# Patient Record
Sex: Male | Born: 1975
Health system: Southern US, Community
[De-identification: ages and names within clinical notes are randomized; demographics above are authoritative.]

## PROBLEM LIST (undated history)

## (undated) DIAGNOSIS — G8929 Other chronic pain: Secondary | ICD-10-CM

## (undated) DIAGNOSIS — T4145XA Adverse effect of unspecified anesthetic, initial encounter: Secondary | ICD-10-CM

## (undated) DIAGNOSIS — R52 Pain, unspecified: Secondary | ICD-10-CM

## (undated) DIAGNOSIS — M549 Dorsalgia, unspecified: Secondary | ICD-10-CM

## (undated) DIAGNOSIS — T8859XA Other complications of anesthesia, initial encounter: Secondary | ICD-10-CM

## (undated) HISTORY — DX: Other chronic pain: G89.29

## (undated) HISTORY — PX: SHOULDER SURGERY: SHX246

## (undated) HISTORY — PX: VASECTOMY: SHX75

## (undated) HISTORY — PX: OTHER SURGICAL HISTORY: SHX169

## (undated) HISTORY — PX: LEG SURGERY: SHX1003

## (undated) HISTORY — DX: Dorsalgia, unspecified: M54.9

---

## 2008-07-02 ENCOUNTER — Ambulatory Visit: Payer: Self-pay | Admitting: Orthopedic Surgery

## 2009-10-20 ENCOUNTER — Ambulatory Visit: Payer: Self-pay | Admitting: Internal Medicine

## 2013-01-09 ENCOUNTER — Emergency Department: Payer: Self-pay | Admitting: Emergency Medicine

## 2014-08-13 ENCOUNTER — Encounter: Payer: Self-pay | Admitting: Family Medicine

## 2014-08-13 DIAGNOSIS — Z8781 Personal history of (healed) traumatic fracture: Secondary | ICD-10-CM

## 2014-08-13 DIAGNOSIS — S39012A Strain of muscle, fascia and tendon of lower back, initial encounter: Secondary | ICD-10-CM | POA: Insufficient documentation

## 2014-10-01 DIAGNOSIS — Z0000 Encounter for general adult medical examination without abnormal findings: Secondary | ICD-10-CM | POA: Insufficient documentation

## 2014-10-04 ENCOUNTER — Ambulatory Visit (INDEPENDENT_AMBULATORY_CARE_PROVIDER_SITE_OTHER): Payer: Medicaid Other | Admitting: Family Medicine

## 2014-10-04 ENCOUNTER — Encounter: Payer: Self-pay | Admitting: Family Medicine

## 2014-10-04 VITALS — BP 136/78 | HR 78 | Ht 68.0 in | Wt 184.0 lb

## 2014-10-04 DIAGNOSIS — M5442 Lumbago with sciatica, left side: Secondary | ICD-10-CM

## 2014-10-04 DIAGNOSIS — M5441 Lumbago with sciatica, right side: Secondary | ICD-10-CM

## 2014-10-04 DIAGNOSIS — Z3009 Encounter for other general counseling and advice on contraception: Secondary | ICD-10-CM | POA: Diagnosis not present

## 2014-10-04 LAB — HEMOCCULT GUIAC POC 1CARD (OFFICE): FECAL OCCULT BLD: NEGATIVE

## 2014-10-04 MED ORDER — CYCLOBENZAPRINE HCL 10 MG PO TABS: 10.0000 mg | ORAL_TABLET | Freq: Three times a day (TID) | ORAL | Status: DC | PRN

## 2014-10-04 NOTE — Progress Notes (Signed)
Name: Cole Austin   MRN: [id number]    DOB: 03-13-1976   Date:10/04/2014       Progress Note  Subjective  Chief Complaint  Chief Complaint  Patient presents with  . Sterilization    consultation for vasectomy  . Back Pain    needs refill on Flexeril    Back Pain This is a recurrent problem. The current episode started more than 1 year ago. The problem occurs daily. The problem is unchanged. The pain is present in the lumbar spine and sacro-iliac. The quality of the pain is described as aching. The pain is mild. The pain is worse during the day. The symptoms are aggravated by bending and twisting. Pertinent negatives include no abdominal pain, bladder incontinence, bowel incontinence, headaches, leg pain, numbness, paresis, paresthesias, perianal numbness, tingling, weakness or weight loss. He has tried analgesics for the symptoms. The treatment provided mild relief.    No problem-specific assessment & plan notes found for this encounter.   Past Medical History  Diagnosis Date  . Chronic back pain     Past Surgical History  Procedure Laterality Date  . Leg surgery      relieve pressure in leg  . Shoulder surgery Right     torn ligament/ exploratory  . Mouth wired      Family History  Problem Relation Age of Onset  . Cancer Maternal Uncle   . Heart disease Maternal Grandfather     History   Social History  . Marital Status: Married    Spouse Name: N/A  . Number of Children: N/A  . Years of Education: N/A   Occupational History  . Not on file.   Social History Main Topics  . Smoking status: Current Every Day Smoker  . Smokeless tobacco: Not on file  . Alcohol Use: 0.0 oz/week    0 Standard drinks or equivalent per week  . Drug Use: No  . Sexual Activity: Yes   Other Topics Concern  . Not on file   Social History Narrative  . No narrative on file    No Known Allergies   Review of Systems  Constitutional: Negative.  Negative for weight loss.   HENT: Negative.   Eyes: Negative.   Respiratory: Negative.   Cardiovascular: Negative.   Gastrointestinal: Negative.  Negative for abdominal pain, constipation, blood in stool and bowel incontinence.  Genitourinary: Negative.  Negative for bladder incontinence.  Musculoskeletal: Positive for myalgias and back pain. Negative for joint pain and falls.       Recurrent spasm  Skin: Negative.  Negative for rash.  Neurological: Negative for tingling, sensory change, focal weakness, weakness, numbness, headaches and paresthesias.  Endo/Heme/Allergies: Negative.   Psychiatric/Behavioral: Negative for depression.       No anhedonism     Objective  Filed Vitals:   10/04/14 0811  BP: 136/78  Pulse: 78  Height: 5\' 8"  (1.727 m)  Weight: 184 lb (83.462 kg)    Physical Exam  Constitutional: He is oriented to person, place, and time and well-developed, well-nourished, and in no distress.  HENT:  Head: Normocephalic and atraumatic.  Right Ear: External ear normal.  Left Ear: External ear normal.  Nose: Nose normal.  Mouth/Throat: Oropharynx is clear and moist.  Eyes: Conjunctivae and EOM are normal. Pupils are equal, round, and reactive to light.  Neck: Normal range of motion. Neck supple. No JVD present. No thyromegaly present.  Cardiovascular: Normal rate, regular rhythm, normal heart sounds and intact distal pulses.  No murmur heard. Pulmonary/Chest: Effort normal and breath sounds normal.  Abdominal: Soft. Bowel sounds are normal. He exhibits no distension. There is no tenderness. There is no guarding.  Genitourinary: Rectum normal, prostate normal and penis normal. Guaiac negative stool.  Musculoskeletal: Normal range of motion. He exhibits no edema or tenderness.  Neurological: He is alert and oriented to person, place, and time. He has normal reflexes. He displays normal reflexes. No cranial nerve deficit. He exhibits normal muscle tone. Gait normal.  Skin: Skin is warm and dry.  No erythema.  Psychiatric: Mood and affect normal.      No results found for this or any previous visit (from the past 2160 hour(s)).   Assessment & Plan  Problem List Items Addressed This Visit    None    Visit Diagnoses    Bilateral low back pain with sciatica, sciatica laterality unspecified    -  Primary    Relevant Medications    cyclobenzaprine (FLEXERIL) 10 MG tablet    Sterilization consult        Relevant Orders    POCT Occult Blood Stool         Dr. Deanna Jones [facility]  10/04/2014

## 2014-10-19 ENCOUNTER — Ambulatory Visit: Payer: Medicaid Other | Admitting: Urology

## 2014-10-20 ENCOUNTER — Encounter: Payer: Self-pay | Admitting: Urology

## 2014-10-20 ENCOUNTER — Ambulatory Visit (INDEPENDENT_AMBULATORY_CARE_PROVIDER_SITE_OTHER): Payer: Medicaid Other | Admitting: Urology

## 2014-10-20 VITALS — BP 124/74 | HR 90 | Resp 18 | Ht 68.0 in | Wt 186.7 lb

## 2014-10-20 DIAGNOSIS — Z309 Encounter for contraceptive management, unspecified: Secondary | ICD-10-CM | POA: Diagnosis not present

## 2014-10-20 DIAGNOSIS — Z3009 Encounter for other general counseling and advice on contraception: Secondary | ICD-10-CM | POA: Insufficient documentation

## 2014-10-20 MED ORDER — DIAZEPAM 10 MG PO TABS: 10.0000 mg | ORAL_TABLET | Freq: Four times a day (QID) | ORAL | Status: DC | PRN

## 2014-10-20 NOTE — Progress Notes (Signed)
10/20/2014 2:50 PM   Cole Austin 25-Oct-1975 [id number]  Referring provider: Juline Patch, MD [address] [place], [place] [address]  Chief Complaint  Patient presents with  . VAS Consult    HPI: Vasectomy Consult Note Number of children: 4 Patient has no history of chronic prostatitis, epididymitis, orchitis, or other genital pain:  Today, we discussed what the vas deferens is, where it is located, and its function. We reviewed the procedure for vasectomy, it's risks, benefits, alternatives, and likelihood of achieving his goals. We discussed in detail the procedure, complications, and recovery as well as the need for clearance prior to unprotected intercourse. We discussed that vasectomy does not protect against sexually transmitted diseases. We discussed that this procedure does not result in immediate sterility and that they would need to use other forms of birth control until he has been cleared with a three month negative postvasectomy semen analyses. I explained that the procedure is considered to be permanent and that attempts at reversal have varying degrees of success. These options include vasectomy reversal, sperm retrieval, and in vitro fertilization; these can be very expensive. We discussed the chance of postvasectomy pain syndrome which occurs in less than 5% of patients. I explained to the patient that there is no treatment to resolve this chronic pain, and that if it developed I would not be able to help resolve the issue, but that surgery is generally not needed for correction. I explained there have even been reports of systemic like illness associated with this chronic pain, and that there was no good cure. I explained that vasectomy it is not a 100% reliable form of birth control, and the risk of pregnancy after vasectomy is approximately 1 in 2000 men who had a negative postvasectomy semen analysis or rare non-motile sperm. I explained that repeat  vasectomy was necessary in less than 1% of vasectomy procedures when employing the type of technique that is performed in the office. I explained that he should refrain from ejaculation for approximately one week following vasectomy. I explained that there are other options for birth control which are permanent and non-permanent; we discussed these. I explained the rates of surgical complications, such as symptomatic hematoma or infection, are low (1-2%) and vary with the surgeon's experience and criteria used to diagnose the complication.  The patient had the opportunity to ask questions to his stated satisfaction. He voiced understanding of the above factors and stated that he has read all the information provided to him and the packets and informed consent.   PMH: Past Medical History  Diagnosis Date  . Chronic back pain     Surgical History: Past Surgical History  Procedure Laterality Date  . Leg surgery      relieve pressure in leg  . Shoulder surgery Right     torn ligament/ exploratory  . Mouth wired      Home Medications:    Medication List       This list is accurate as of: 10/20/14  2:50 PM.  Always use your most recent med list.               cyclobenzaprine 10 MG tablet  Commonly known as:  FLEXERIL  Take 1 tablet (10 mg total) by mouth every 8 (eight) hours as needed.        Allergies: No Known Allergies  Family History: Family History  Problem Relation Age of Onset  . Cancer Maternal Uncle   . Heart  disease Maternal Grandfather     Social History:  reports that he has been smoking.  He does not have any smokeless tobacco history on file. He reports that he drinks alcohol. He reports that he does not use illicit drugs.  ROS: Urological Symptom Review  Patient is experiencing the following symptoms: Vas consult   Review of Systems  Gastrointestinal (upper)  : Negative for upper GI symptoms  Gastrointestinal (lower) : Negative for lower GI  symptoms  Constitutional : Negative for symptoms  Skin: Negative for skin symptoms  Eyes: Negative for eye symptoms  Ear/Nose/Throat : Negative for Ear/Nose/Throat symptoms  Hematologic/Lymphatic: Negative for Hematologic/Lymphatic symptoms  Cardiovascular : Negative for cardiovascular symptoms  Respiratory : Cough  Endocrine: Negative for endocrine symptoms  Musculoskeletal: Negative for musculoskeletal symptoms  Neurological: Negative for neurological symptoms  Psychologic: Negative for psychiatric symptoms   Physical Exam: BP 124/74 mmHg  Pulse 90  Resp 18  Ht 5\' 8"  (1.727 m)  Wt 186 lb 11.2 oz (84.687 kg)  BMI 28.39 kg/m2  GU: Patient with circumscied phallus.  Urethral meatus is patent.  No penile discharge. No penile lesions or rashes. Scrotum without lesions, cysts, rashes and/or edema.  Testicles are located scrotally bilaterally. No masses are appreciated in the testicles. Left and right epididymis are normal.   Laboratory Data: No results found for: WBC, HGB, HCT, MCV, PLT  No results found for: CREATININE  No results found for: PSA  No results found for: TESTOSTERONE  No results found for: HGBA1C  Urinalysis No results found for: COLORURINE, APPEARANCEUR, LABSPEC, PHURINE, GLUCOSEU, HGBUR, BILIRUBINUR, KETONESUR, PROTEINUR, UROBILINOGEN, NITRITE, LEUKOCYTESUR  Pertinent Imaging:   Assessment & Plan:       1. Vasectomy consult:   Patient has read and signed the consent.  He is given the pre-op vasectomy instruction sheet.  He is prescribed Valium 10 mg and instructed to take it 30 minutes prior to his vasectomy appointment.  He is to have a driver.  I reemphasized to the patient that this is to be considered a permanent form of birth control, that he is to use an alternative form of birth control until we receive the 3 months specimen and it is cleared of sperm and that this will not prevent STI's.  His questions are answered to his  satisfaction and he understands the risks and is willing to proceed with the vasectomy.  He will schedule his vasectomy.      There are no diagnoses linked to this encounter.  No Follow-up on file.  Zara Council, [facility] [address] [place], [place] [address] [phone]

## 2014-11-08 ENCOUNTER — Other Ambulatory Visit: Payer: Self-pay

## 2014-11-08 ENCOUNTER — Telehealth: Payer: Self-pay | Admitting: Urology

## 2014-11-08 DIAGNOSIS — Z3009 Encounter for other general counseling and advice on contraception: Secondary | ICD-10-CM

## 2014-11-08 MED ORDER — DIAZEPAM 10 MG PO TABS: 10.0000 mg | ORAL_TABLET | Freq: Four times a day (QID) | ORAL | Status: DC | PRN

## 2014-11-08 NOTE — Telephone Encounter (Signed)
Patient called this morning about his prescription for valium.  He went to the [facility] in [place], [place] and they did not have the prescription.  Will you please call it in for him?

## 2014-11-08 NOTE — Progress Notes (Signed)
Valium faxed to pharmacy.

## 2014-11-11 ENCOUNTER — Ambulatory Visit (INDEPENDENT_AMBULATORY_CARE_PROVIDER_SITE_OTHER): Payer: Medicaid Other | Admitting: Urology

## 2014-11-11 VITALS — BP 107/73 | HR 81 | Ht 68.0 in | Wt 182.0 lb

## 2014-11-11 DIAGNOSIS — Z302 Encounter for sterilization: Secondary | ICD-10-CM | POA: Diagnosis not present

## 2014-11-11 NOTE — Progress Notes (Signed)
Bilateral Vasectomy Procedure  Pre-Procedure: - Patient's scrotum was prepped and draped for vasectomy. - The vas was palpated through the scrotal skin on the left. - 1% Xylocaine was injected into the midline skin and surrounding tissue for placement  - In a similar manner, the vas on the right was identified, anesthetized, and stabilized.  Procedure: - A bladeless technique was used to open the overlying skin (sharp dissection) - The left vas was isolated and brought up through the incision exposing that structure. - Bleeding points were cauterized as they occurred. - The vas was free from the surrounding structures and brought to the view. - A segment was positioned for placement with a hemostat. - A second hemostat was placed and a small segment between the two hemostats and was removed for inspection. - Each end of the transected vas lumen was fulgurated/ obliterated using needlepoint electrocautery -A fascial interposition was performed on testicular end of the vas using #3-0 chromic suture -The same procedure was performed on the right. - A single suture of #3-0 chromic catgut was used to close each lateral scrotal skin incision - A dressing was applied.  Post-Procedure: - Patient was instructed in care of the operative area - A specimen is to be delivered in 12 weeks   -Another form of contraception is to be used until

## 2015-07-05 ENCOUNTER — Other Ambulatory Visit: Payer: Self-pay

## 2015-07-05 DIAGNOSIS — Z9852 Vasectomy status: Secondary | ICD-10-CM

## 2015-07-06 ENCOUNTER — Other Ambulatory Visit: Payer: Medicaid Other

## 2015-07-06 DIAGNOSIS — Z9852 Vasectomy status: Secondary | ICD-10-CM

## 2015-07-07 LAB — POST-VAS SPERM EVALUATION,QUAL: Volume: 1.5 mL

## 2015-07-11 ENCOUNTER — Telehealth: Payer: Self-pay

## 2015-07-11 NOTE — Telephone Encounter (Signed)
Patient notified and was transferred to the front to be scheduled/SW

## 2015-07-11 NOTE — Telephone Encounter (Signed)
-----   Message from Hollice Espy, MD sent at 07/08/2015 10:51 AM EDT ----- Please let him know there ARE sperm and unfortunately I think his vasectomy did not work.  Please have him come back to see [place] in the clinic.    Hollice Espy, MD

## 2015-07-29 ENCOUNTER — Encounter: Payer: Self-pay | Admitting: Urology

## 2015-07-29 ENCOUNTER — Ambulatory Visit (INDEPENDENT_AMBULATORY_CARE_PROVIDER_SITE_OTHER): Payer: Medicaid Other | Admitting: Urology

## 2015-07-29 VITALS — BP 115/73 | HR 80 | Temp 98.3°F | Resp 14 | Ht 68.0 in | Wt 197.0 lb

## 2015-07-29 DIAGNOSIS — Z309 Encounter for contraceptive management, unspecified: Secondary | ICD-10-CM

## 2015-07-29 DIAGNOSIS — Z3009 Encounter for other general counseling and advice on contraception: Secondary | ICD-10-CM

## 2015-07-29 NOTE — Progress Notes (Signed)
07/29/2015 5:57 PM   Cole Austin [birth year] [id number]  Referring provider: Juline Patch, MD [address] [place], [place] [address]  Chief Complaint  Patient presents with  . Advice Only    Patient is here for consultation after having vasectomy done. Procedure did not work.    HPI: 40 year old male who underwent vasectomy on 11/11/2014 by Dr. Elnoria Howard. He returned for follow-up semen analysis on 07/06/2015 which showed persistent sperm concerning for failure of the vasectomy. He was asked to return today to discuss the possibility of repeat vasectomy.  He had no issues following his initial procedure.  He does have 4 children and desires no further biological children.   PMH: Past Medical History  Diagnosis Date  . Chronic back pain     Surgical History: Past Surgical History  Procedure Laterality Date  . Leg surgery      relieve pressure in leg  . Shoulder surgery Right     torn ligament/ exploratory  . Mouth wired      Home Medications:    Medication List       This list is accurate as of: 07/29/15  5:57 PM.  Always use your most recent med list.               cyclobenzaprine 10 MG tablet  Commonly known as:  FLEXERIL  Take 1 tablet (10 mg total) by mouth every 8 (eight) hours as needed.        Allergies: No Known Allergies  Family History: Family History  Problem Relation Age of Onset  . Cancer Maternal Uncle   . Heart disease Maternal Grandfather   . Kidney cancer Neg Hx   . Prostate cancer Neg Hx     Social History:  reports that he has been smoking.  His smokeless tobacco use includes Chew. He reports that he drinks alcohol. He reports that he does not use illicit drugs.  ROS: UROLOGY Frequent Urination?: No Hard to postpone urination?: No Burning/pain with urination?: No Get up at night to urinate?: No Leakage of urine?: No Urine stream starts and stops?: No Trouble starting stream?: No Do you have to strain to  urinate?: No Blood in urine?: No Urinary tract infection?: No Sexually transmitted disease?: No Injury to kidneys or bladder?: No Painful intercourse?: No Weak stream?: No Erection problems?: No Penile pain?: No  Gastrointestinal Nausea?: No Vomiting?: No Indigestion/heartburn?: No Diarrhea?: No Constipation?: No  Constitutional Fever: No Night sweats?: No Weight loss?: No Fatigue?: No  Skin Skin rash/lesions?: No Itching?: No  Eyes Blurred vision?: No Double vision?: No  Ears/Nose/Throat Sore throat?: No Sinus problems?: No  Hematologic/Lymphatic Swollen glands?: No Easy bruising?: No  Cardiovascular Leg swelling?: No Chest pain?: No  Respiratory Cough?: No Shortness of breath?: No  Endocrine Excessive thirst?: No  Musculoskeletal Back pain?: No Joint pain?: No  Neurological Headaches?: No Dizziness?: No  Psychologic Depression?: No Anxiety?: No  Physical Exam: BP 115/73 mmHg  Pulse 80  Temp(Src) 98.3 F (36.8 C)  Resp 14  Ht 5\' 8"  (1.727 m)  Wt 197 lb (89.359 kg)  BMI 29.96 kg/m2  Constitutional:  Alert and oriented, No acute distress. HEENT: [place] AT, moist mucus membranes.  Trachea midline, no masses. Cardiovascular: No clubbing, cyanosis, or edema. Respiratory: Normal respiratory effort, no increased work of breathing. GI: Abdomen is soft, nontender, nondistended, no abdominal masses GU: No CVA tenderness. Bilateral descended testicles, normal scrotum. Small midline vasectomy incision noted. Left vas palpable but  somewhat posterior. Vas defect is appreciated. Right vas is difficult to palpate due to possible lipoma of the cord.  Circumcised phallus. Skin: No rashes, bruises or suspicious lesions. Neurologic: Grossly intact, no focal deficits, moving all 4 extremities. Psychiatric: Normal mood and affect.   Assessment & Plan:   1. Vasectomy evaluation S/p vasectomy failure with persistent sperm on SA We discussed that there is a  very low possibility of this and unfortunately his vasectomy seems to have failed. He was offered repeat vasectomy. Given difficulty palpating his right cord, I have advised him to undergo vasectomy redo in the operating room Risk and benefits of procedure were discussed at length today including risk of bleeding, infection, hematoma, edema, vasectomy failure, chronic testicular pain were discussed. Patient is wearing was willing to proceed as planned.  Schedule redo vasectomy in [place], [facility] [address] [place], [place] [address] [phone]

## 2015-08-01 ENCOUNTER — Telehealth: Payer: Self-pay | Admitting: Radiology

## 2015-08-01 NOTE — Telephone Encounter (Signed)
LMOM. Need to notify pt of surgery information. 

## 2015-08-01 NOTE — Telephone Encounter (Signed)
Notified pt of surgery scheduled 08/09/15, pre-admit testing phone interview 4/12 between 9am-1pm & to call day prior to surgery for arrival time to SDS. Pt voices understanding.

## 2015-08-03 ENCOUNTER — Encounter: Payer: Self-pay | Admitting: *Deleted

## 2015-08-03 ENCOUNTER — Other Ambulatory Visit: Payer: Self-pay

## 2015-08-03 NOTE — Pre-Procedure Instructions (Signed)
STATES WAKES UP IN MIDDLE OF SURGERIES

## 2015-08-08 ENCOUNTER — Encounter: Payer: Self-pay | Admitting: Emergency Medicine

## 2015-08-08 ENCOUNTER — Ambulatory Visit
Admission: EM | Admit: 2015-08-08 | Discharge: 2015-08-08 | Disposition: A | Discharge status: Home / Self Care | Payer: Medicaid Other | Attending: Family Medicine | Admitting: Family Medicine

## 2015-08-08 ENCOUNTER — Ambulatory Visit: Payer: Medicaid Other

## 2015-08-08 ENCOUNTER — Other Ambulatory Visit: Payer: Self-pay

## 2015-08-08 DIAGNOSIS — S92212B Displaced fracture of cuboid bone of left foot, initial encounter for open fracture: Secondary | ICD-10-CM | POA: Insufficient documentation

## 2015-08-08 DIAGNOSIS — S93602A Unspecified sprain of left foot, initial encounter: Secondary | ICD-10-CM | POA: Diagnosis not present

## 2015-08-08 DIAGNOSIS — X58XXXA Exposure to other specified factors, initial encounter: Secondary | ICD-10-CM | POA: Diagnosis not present

## 2015-08-08 DIAGNOSIS — F17200 Nicotine dependence, unspecified, uncomplicated: Secondary | ICD-10-CM | POA: Diagnosis not present

## 2015-08-08 DIAGNOSIS — M25572 Pain in left ankle and joints of left foot: Secondary | ICD-10-CM | POA: Diagnosis present

## 2015-08-08 MED ORDER — OXYCODONE-ACETAMINOPHEN 5-325 MG PO TABS
1.0000 | ORAL_TABLET | Freq: Three times a day (TID) | ORAL | Status: DC | PRN
Start: 2015-08-08 — End: 2016-01-25

## 2015-08-08 MED ORDER — IBUPROFEN 800 MG PO TABS: 800.0000 mg | ORAL_TABLET | Freq: Three times a day (TID) | ORAL | Status: DC | PRN

## 2015-08-08 NOTE — ED Provider Notes (Signed)
[facility]  ____________________________________________  Time seen: Approximately 3:25 PM  I have reviewed the triage vital signs and the nursing notes.   HISTORY  Chief Complaint Ankle Pain   HPI Cole Austin is a 40 y.o. male presents with a complaint of left foot and left ankle pain since 2 hours prior to arrival. Patient reports that he was trying to reach something on top of the vehicle. Patient reports that he had his right foot on the tire which slipped off causing him to land on his left foot. Patient states that he landed standing but then rolled his left foot and ankle. Patient reports that he is unable to weight-bear since. Denies fall to ground. Denies head injury or loss of consciousness. Denies any other pain or injury.  Patient is a pain is 8 out of 10 directly to left lateral foot which then goes across the top of his foot. Patient again reports pain is increased with trying to weight-bear. Denies any numbness or loss of sensation. Denies pain radiation.  Denies chest pain or shortness breath, abdominal pain, neck pain, back pain, other extremity pain or injury.   PCP: Ronnald Ramp      Past Medical History  Diagnosis Date  . Chronic back pain   . Pain     CHRONIC BACK  . Complication of anesthesia     WOKE UP DURING ALL SURGERIES    Patient Active Problem List   Diagnosis Date Noted  . Encounter for general adult medical examination without abnormal findings 10/01/2014  . Low back strain 08/13/2014  . Hx of fracture of rib 08/13/2014    Past Surgical History  Procedure Laterality Date  . Leg surgery      relieve pressure in leg  . Shoulder surgery Right     torn ligament/ exploratory  . Mouth wired      Current Outpatient Rx  Name  Route  Sig  Dispense  Refill  .             Allergies Review of patient's allergies indicates no known allergies.  Family History  Problem Relation Age of Onset  . Cancer Maternal Uncle   . Heart  disease Maternal Grandfather   . Kidney cancer Neg Hx   . Prostate cancer Neg Hx     Social History Social History  Substance Use Topics  . Smoking status: Current Every Day Smoker  . Smokeless tobacco: Current User    Types: Chew  . Alcohol Use: No    Review of Systems Constitutional: No fever/chills Eyes: No visual changes. ENT: No sore throat. Cardiovascular: Denies chest pain. Respiratory: Denies shortness of breath. Gastrointestinal: No abdominal pain.  No nausea, no vomiting.  No diarrhea.  No constipation. Genitourinary: Negative for dysuria. Musculoskeletal: Negative for back pain.Positive left foot and ankle pain  Skin: Negative for rash. Neurological: Negative for headaches, focal weakness or numbness.  10-point ROS otherwise negative.  ____________________________________________   PHYSICAL EXAM:  VITAL SIGNS: ED Triage Vitals  Enc Vitals Group     BP 08/08/15 1431 127/73 mmHg     Pulse Rate 08/08/15 1431 90     Resp 08/08/15 1431 16     Temp 08/08/15 1431 97 F (36.1 C)     Temp Source 08/08/15 1431 Tympanic     SpO2 08/08/15 1431 100 %     Weight 08/08/15 1431 185 lb (83.915 kg)     Height 08/08/15 1431 5\' 8"  (1.727 m)  Head Cir --      Peak Flow --      Pain Score 08/08/15 1434 6     Pain Loc --      Pain Edu? --      Excl. in [place]? --     Constitutional: Alert and oriented. Well appearing and in no acute distress. Eyes: Conjunctivae are normal. PERRL. EOMI. Head: Atraumatic.  Mouth/Throat: Mucous membranes are moist.   Neck: No stridor.  No cervical spine tenderness to palpation. Cardiovascular: Normal rate, regular rhythm. Grossly normal heart sounds.  Good peripheral circulation. Respiratory: Normal respiratory effort.  No retractions. Lungs CTAB. Gastrointestinal: Soft and nontender.  Musculoskeletal: No lower or upper extremity tenderness nor edema.  No cervical, thoracic or lumbar tenderness to palpation. Bilateral pedal pulses equal  and easily palpated.  Except : Left lateral mid foot moderate tenderness to palpation, mild swelling, mild ecchymosis. Mild generalized dorsal mid foot pain without swelling or ecchymosis; Mild pain with plantar flexion and dorsiflexion to left lateral foot; Slightly limited left ankle rotation range of motion with mild pain. Left lower extremity otherwise nontender. Sensation intact to left foot with <2 seconds capillary refill time to all left foot distal toes.  Neurologic:  Normal speech and language. No gross focal neurologic deficits are appreciated. No gait instability. Skin:  Skin is warm, dry and intact. No rash noted. Psychiatric: Mood and affect are normal. Speech and behavior are normal.  ____________________________________________   LABS (all labs ordered are listed, but only abnormal results are displayed)  Labs Reviewed - No data to display  RADIOLOGY   EXAM: LEFT ANKLE COMPLETE - 3+ VIEW  COMPARISON: None.  FINDINGS: Frontal, oblique, and lateral views were obtained. There is no fracture or joint effusion. The ankle mortise appears intact. There is a minimal posterior calcaneal spur. No appreciable joint space narrowing.  IMPRESSION: No fracture. Mortise intact. Small posterior calcaneal spur.   Electronically Signed By: Lowella Grip III M.D. On: 08/08/2015 14:55          DG Foot Complete Left (Final result) Result time: 08/08/15 14:56:23   Final result by Rad Results In Interface (08/08/15 14:56:23)   Narrative:   CLINICAL DATA: Pain following twisting injury with fall  EXAM: LEFT FOOT - COMPLETE 3+ VIEW  COMPARISON: None.  FINDINGS: Frontal, oblique, and lateral views were obtained. There is no demonstrable fracture or dislocation. The joint spaces appear normal. No erosive change. There is a minimal posterior calcaneal spur.  IMPRESSION: No fracture or dislocation. No appreciable arthropathy. There is a minimal posterior  calcaneal spur.   Electronically Signed By: Lowella Grip III M.D. On: 08/08/2015 14:56    I, Marylene Land, personally viewed and evaluated these images (plain radiographs) as part of my medical decision making, as well as reviewing the written report by the radiologist.  ____________________________________________   PROCEDURES  Procedure(s) performed:  Left posterior OCL splint applied by RN. Neurovascular intact post application. Crutches given. _________________________________________   INITIAL IMPRESSION / ASSESSMENT AND PLAN / ED COURSE  Pertinent labs & imaging results that were available during my care of the patient were reviewed by me and considered in my medical decision making (see chart for details).  Well-appearing patient. No acute distress. present for complaints of left foot pain post mechanical injury. Pain point reproducible to left lateral midfoot. Per radiologist's left foot and left ankle x-rays no acute injury however in reviewing with patient point tenderness concern on x-ray for left cuboid fracture. Discussed this in  detail with patient. Will place patient in splint, crutches and encouraged rest and elevation. When necessary ibuprofen and Percocet. Encouraged patient follow-up with orthopedics at the end of this week. Information for orthopedic given. Discussed indication, risks and benefits of medications with patient.  Discussed follow up with Primary care physician this week. Discussed follow up and return parameters including no resolution or any worsening concerns. Patient verbalized understanding and agreed to plan.   ____________________________________________   FINAL CLINICAL IMPRESSION(S) / ED DIAGNOSES  Final diagnoses:  Left cuboid fracture, open, initial encounter  Foot sprain, left, initial encounter      Note: This dictation was prepared with Dragon dictation along with smaller phrase technology. Any transcriptional errors  that result from this process are unintentional.   Marylene Land, NP 08/08/15 1619

## 2015-08-08 NOTE — Discharge Instructions (Signed)
Take medication as prescribed. Rest. Apply ice and elevate.   Follow up with orthopedic this week as discussed, call today to schedule.  Follow up with your primary care physician this week as needed. Return to Urgent care for new or worsening concerns.   Foot Sprain A foot sprain is an injury to one of the strong bands of tissue (ligaments) that connect and support the many bones in your feet. The ligament can be stretched too much or it can tear. A tear can be either partial or complete. The severity of the sprain depends on how much of the ligament was damaged or torn. CAUSES A foot sprain is usually caused by suddenly twisting or pivoting your foot. RISK FACTORS This injury is more likely to occur in people who:  Play a sport, such as basketball or football.  Exercise or play a sport without warming up.  Start a new workout or sport.  Suddenly increase how long or hard they exercise or play a sport. SYMPTOMS Symptoms of this condition start soon after an injury and include:  Pain, especially in the arch of the foot.  Bruising.  Swelling.  Inability to walk or use the foot to support body weight. DIAGNOSIS This condition is diagnosed with a medical history and physical exam. You may also have imaging tests, such as:  X-rays to make sure there are no broken bones (fractures).  MRI to see if the ligament has torn. TREATMENT Treatment varies depending on the severity of your sprain. Mild sprains can be treated with rest, ice, compression, and elevation (RICE). If your ligament is overstretched or partially torn, treatment usually involves keeping your foot in a fixed position (immobilization) for a period of time. To help you do this, your health care provider will apply a bandage, splint, or walking boot to keep your foot from moving until it heals. You may also be advised to use crutches or a scooter for a few weeks to avoid bearing weight on your foot while it is healing. If  your ligament is fully torn, you may need surgery to reconnect the ligament to the bone. After surgery, a cast or splint will be applied and will need to stay on your foot while it heals. Your health care provider may also suggest exercises or physical therapy to strengthen your foot. HOME CARE INSTRUCTIONS If You Have a Bandage, Splint, or Walking Boot:  Wear it as directed by your health care provider. Remove it only as directed by your health care provider.  Loosen the bandage, splint, or walking boot if your toes become numb and tingle, or if they turn cold and blue. Bathing  If your health care provider approves bathing and showering, cover the bandage or splint with a watertight plastic bag to protect it from water. Do not let the bandage or splint get wet. Managing Pain, Stiffness, and Swelling   If directed, apply ice to the injured area:  Put ice in a plastic bag.  Place a towel between your skin and the bag.  Leave the ice on for 20 minutes, 2-3 times per day.  Move your toes often to avoid stiffness and to lessen swelling.  Raise (elevate) the injured area above the level of your heart while you are sitting or lying down. Driving  Do not drive or operate heavy machinery while taking pain medicine.  Do not drive while wearing a bandage, splint, or walking boot on a foot that you use for driving. Activity  Rest  as directed by your health care provider.  Do not use the injured foot to support your body weight until your health care provider says that you can. Use crutches or other supportive devices as directed by your health care provider.  Ask your health care provider what activities are safe for you. Gradually increase how much and how far you walk until your health care provider says it is safe to return to full activity.  Do any exercise or physical therapy as directed by your health care provider. General Instructions  If a splint was applied, do not put  pressure on any part of it until it is fully hardened. This may take several hours.  Take medicines only as directed by your health care provider. These include over-the-counter medicines and prescription medicines.  Keep all follow-up visits as directed by your health care provider. This is important.  When you can walk without pain, wear supportive shoes that have stiff soles. Do not wear flip-flops, and do not walk barefoot. SEEK MEDICAL CARE IF:  Your pain is not controlled with medicine.  Your bruising or swelling gets worse or does not get better with treatment.  Your splint or walking boot is damaged. SEEK IMMEDIATE MEDICAL CARE IF:  Your foot is numb or blue.  Your foot feels colder than normal.   This information is not intended to replace advice given to you by your health care provider. Make sure you discuss any questions you have with your health care provider.   Document Released: 09/29/2001 Document Revised: 08/24/2014 Document Reviewed: 02/10/2014 Elsevier Interactive Patient Education [employer].

## 2015-08-08 NOTE — ED Notes (Signed)
Patient states that he was stepping up on his Lucianne Lei and his left ankle bent to the side and he felt a pop in his left ankle about 2 hours ago.

## 2015-08-09 ENCOUNTER — Other Ambulatory Visit: Payer: Self-pay

## 2015-08-09 ENCOUNTER — Ambulatory Visit
Admission: RE | Admit: 2015-08-09 | Discharge: 2015-08-09 | Disposition: A | Discharge status: Home / Self Care | Payer: Medicaid Other | Source: Ambulatory Visit | Attending: Urology | Admitting: Urology

## 2015-08-09 ENCOUNTER — Encounter
Admission: RE | Disposition: A | Discharge status: Home / Self Care | Payer: Self-pay | Source: Ambulatory Visit | Attending: Urology

## 2015-08-09 ENCOUNTER — Ambulatory Visit: Payer: Medicaid Other | Admitting: Certified Registered Nurse Anesthetist

## 2015-08-09 ENCOUNTER — Encounter: Payer: Self-pay | Admitting: *Deleted

## 2015-08-09 DIAGNOSIS — Z8249 Family history of ischemic heart disease and other diseases of the circulatory system: Secondary | ICD-10-CM | POA: Insufficient documentation

## 2015-08-09 DIAGNOSIS — G8929 Other chronic pain: Secondary | ICD-10-CM | POA: Insufficient documentation

## 2015-08-09 DIAGNOSIS — S93409A Sprain of unspecified ligament of unspecified ankle, initial encounter: Secondary | ICD-10-CM

## 2015-08-09 DIAGNOSIS — M549 Dorsalgia, unspecified: Secondary | ICD-10-CM | POA: Diagnosis not present

## 2015-08-09 DIAGNOSIS — F17220 Nicotine dependence, chewing tobacco, uncomplicated: Secondary | ICD-10-CM | POA: Insufficient documentation

## 2015-08-09 DIAGNOSIS — Z809 Family history of malignant neoplasm, unspecified: Secondary | ICD-10-CM | POA: Diagnosis not present

## 2015-08-09 DIAGNOSIS — D176 Benign lipomatous neoplasm of spermatic cord: Secondary | ICD-10-CM | POA: Diagnosis not present

## 2015-08-09 DIAGNOSIS — Z302 Encounter for sterilization: Secondary | ICD-10-CM | POA: Diagnosis not present

## 2015-08-09 HISTORY — DX: Other complications of anesthesia, initial encounter: T88.59XA

## 2015-08-09 HISTORY — DX: Pain, unspecified: R52

## 2015-08-09 HISTORY — PX: VASECTOMY: SHX75

## 2015-08-09 HISTORY — DX: Adverse effect of unspecified anesthetic, initial encounter: T41.45XA

## 2015-08-09 SURGERY — VASECTOMY
Anesthesia: General | Wound class: Clean Contaminated

## 2015-08-09 MED ORDER — CEFAZOLIN SODIUM-DEXTROSE 2-4 GM/100ML-% IV SOLN: 2.0000 g | INTRAVENOUS | Status: DC

## 2015-08-09 MED ORDER — GLYCOPYRROLATE 0.2 MG/ML IJ SOLN
INTRAMUSCULAR | Status: DC | PRN
  Administered 2015-08-09: 0.2 mg via INTRAVENOUS

## 2015-08-09 MED ORDER — FENTANYL CITRATE (PF) 100 MCG/2ML IJ SOLN: 25.0000 ug | INTRAMUSCULAR | Status: DC | PRN

## 2015-08-09 MED ORDER — ONDANSETRON HCL 4 MG/2ML IJ SOLN: 4.0000 mg | Freq: Once | INTRAMUSCULAR | Status: DC | PRN

## 2015-08-09 MED ORDER — HYDROCODONE-ACETAMINOPHEN 5-325 MG PO TABS: 1.0000 | ORAL_TABLET | Freq: Four times a day (QID) | ORAL | Status: DC | PRN

## 2015-08-09 MED ORDER — FAMOTIDINE 20 MG PO TABS
ORAL_TABLET | ORAL | Status: AC
  Filled 2015-08-09: qty 1

## 2015-08-09 MED ORDER — PROPOFOL 10 MG/ML IV BOLUS
INTRAVENOUS | Status: DC | PRN
  Administered 2015-08-09: 150 mg via INTRAVENOUS

## 2015-08-09 MED ORDER — ONDANSETRON HCL 4 MG/2ML IJ SOLN
INTRAMUSCULAR | Status: DC | PRN
  Administered 2015-08-09: 4 mg via INTRAVENOUS

## 2015-08-09 MED ORDER — CEFAZOLIN SODIUM-DEXTROSE 2-4 GM/100ML-% IV SOLN
INTRAVENOUS | Status: AC
  Filled 2015-08-09: qty 100

## 2015-08-09 MED ORDER — FENTANYL CITRATE (PF) 100 MCG/2ML IJ SOLN
INTRAMUSCULAR | Status: DC | PRN
Start: 2015-08-09 — End: 2015-08-09
  Administered 2015-08-09: 100 ug via INTRAVENOUS

## 2015-08-09 MED ORDER — BUPIVACAINE HCL 0.5 % IJ SOLN
INTRAMUSCULAR | Status: DC | PRN
  Administered 2015-08-09: 13 mL

## 2015-08-09 MED ORDER — FAMOTIDINE 20 MG PO TABS
20.0000 mg | ORAL_TABLET | Freq: Once | ORAL | Status: AC
  Administered 2015-08-09: 20 mg via ORAL

## 2015-08-09 MED ORDER — BUPIVACAINE HCL (PF) 0.5 % IJ SOLN
INTRAMUSCULAR | Status: AC
  Filled 2015-08-09: qty 30

## 2015-08-09 MED ORDER — HYDRALAZINE HCL 20 MG/ML IJ SOLN
10.0000 mg | Freq: Once | INTRAMUSCULAR | Status: AC
  Administered 2015-08-09: 10 mg via INTRAVENOUS

## 2015-08-09 MED ORDER — HYDRALAZINE HCL 20 MG/ML IJ SOLN
INTRAMUSCULAR | Status: AC
Start: 2015-08-09 — End: 2015-08-09
  Administered 2015-08-09: 10 mg via INTRAVENOUS
  Filled 2015-08-09: qty 1

## 2015-08-09 MED ORDER — LACTATED RINGERS IV SOLN
INTRAVENOUS | Status: DC
Start: 2015-08-09 — End: 2015-08-09
  Administered 2015-08-09: 10:00:00 via INTRAVENOUS

## 2015-08-09 MED ORDER — KETOROLAC TROMETHAMINE 30 MG/ML IJ SOLN
INTRAMUSCULAR | Status: DC | PRN
  Administered 2015-08-09: 30 mg via INTRAVENOUS

## 2015-08-09 MED ORDER — LIDOCAINE HCL (CARDIAC) 20 MG/ML IV SOLN
INTRAVENOUS | Status: DC | PRN
  Administered 2015-08-09: 100 mg via INTRAVENOUS

## 2015-08-09 MED ORDER — MIDAZOLAM HCL 2 MG/2ML IJ SOLN
INTRAMUSCULAR | Status: DC | PRN
  Administered 2015-08-09: 3 mg via INTRAVENOUS
  Administered 2015-08-09: 2 mg via INTRAVENOUS

## 2015-08-09 SURGICAL SUPPLY — 35 items
BLADE CLIPPER SURG (BLADE) ×3 IMPLANT
BLADE SURG 15 STRL LF DISP TIS (BLADE) ×1 IMPLANT
BLADE SURG 15 STRL SS (BLADE) ×2
CANISTER SUCT 1200ML W/VALVE (MISCELLANEOUS) ×3 IMPLANT
CHLORAPREP W/TINT 26ML (MISCELLANEOUS) ×3 IMPLANT
DRAPE LAPAROTOMY 77X122 PED (DRAPES) ×3 IMPLANT
DRESSING TELFA 4X3 1S ST N-ADH (GAUZE/BANDAGES/DRESSINGS) ×3 IMPLANT
ELECT CAUTERY NEEDLE TIP 1.0 (MISCELLANEOUS) ×3
ELECT REM PT RETURN 9FT ADLT (ELECTROSURGICAL) ×3
ELECTRODE CAUTERY NEDL TIP 1.0 (MISCELLANEOUS) ×1 IMPLANT
ELECTRODE REM PT RTRN 9FT ADLT (ELECTROSURGICAL) ×1 IMPLANT
GAUZE FLUFF 18X24 1PLY STRL (GAUZE/BANDAGES/DRESSINGS) ×3 IMPLANT
GAUZE SPONGE 4X4 12PLY STRL (GAUZE/BANDAGES/DRESSINGS) ×3 IMPLANT
GLOVE BIO SURGEON STRL SZ 6.5 (GLOVE) ×2 IMPLANT
GLOVE BIO SURGEONS STRL SZ 6.5 (GLOVE) ×1
GOWN STRL REUS W/ TWL LRG LVL3 (GOWN DISPOSABLE) ×2 IMPLANT
GOWN STRL REUS W/TWL LRG LVL3 (GOWN DISPOSABLE) ×4
KIT RM TURNOVER STRD PROC AR (KITS) ×3 IMPLANT
LABEL OR SOLS (LABEL) ×3 IMPLANT
LIQUID BAND (GAUZE/BANDAGES/DRESSINGS) ×3 IMPLANT
NDL SAFETY 22GX1.5 (NEEDLE) ×3 IMPLANT
NEEDLE HYPO 25X1 1.5 SAFETY (NEEDLE) ×3 IMPLANT
NS IRRIG 500ML POUR BTL (IV SOLUTION) ×3 IMPLANT
PACK BASIN MINOR ARMC (MISCELLANEOUS) ×3 IMPLANT
PREP PVP WINGED SPONGE (MISCELLANEOUS) ×3 IMPLANT
SUCTION FRAZIER HANDLE 10FR (MISCELLANEOUS) ×2
SUCTION TUBE FRAZIER 10FR DISP (MISCELLANEOUS) ×1 IMPLANT
SUPPORETR ATHLETIC LG (MISCELLANEOUS) ×1 IMPLANT
SUPPORTER ATHLETIC LG (MISCELLANEOUS) ×3
SUT CHROMIC 3 0 PS 2 (SUTURE) ×3 IMPLANT
SUT CHROMIC 4 0 RB 1X27 (SUTURE) ×3 IMPLANT
SUT VIC AB 3-0 SH 27 (SUTURE)
SUT VIC AB 3-0 SH 27X BRD (SUTURE) IMPLANT
SYRINGE 10CC LL (SYRINGE) ×3 IMPLANT
WATER STERILE IRR 1000ML POUR (IV SOLUTION) ×3 IMPLANT

## 2015-08-09 NOTE — Interval H&P Note (Signed)
History and Physical Interval Note:  08/09/2015 10:37 AM  Cole Austin  has presented today for surgery, with the diagnosis of DESIRES VASECTOMY  The various methods of treatment have been discussed with the patient and family. After consideration of risks, benefits and other options for treatment, the patient has consented to  Procedure(s): VASECTOMY (N/A) as a surgical intervention .  The patient's history has been reviewed, patient examined, no change in status, stable for surgery.  I have reviewed the patient's chart and labs.  Questions were answered to the patient's satisfaction.    RRR CTAB  Hollice Espy

## 2015-08-09 NOTE — Op Note (Signed)
Date of procedure: 08/09/2015  Preoperative diagnosis:  1. Desires vasectomy 2. Post previous vasectomy  3. Previous vasectomy failure  Postoperative diagnosis:  1. Same as above   Procedure: 1. Vasectomy  Surgeon: Hollice Espy, MD  Anesthesia: General  Complications: None  Intraoperative findings: Left as appeared to be previously cut.  Lipoma surrounding the right cord, no palpable previous vasectomy defect  EBL: Minimal  Specimens: Left vas, right vas  Drains:  none  Indication: Cole Austin is a 40 y.o. patient with history of previous vasectomy who was found to have persistent sperm in his ejaculate fluid concerning for vasectomy failure. In the office, his vasa were not easily palpable also was counseled to undergo redo vasectomy in the OR.  After reviewing the management options for treatment, he elected to proceed with the above surgical procedure(s). We have discussed the potential benefits and risks of the procedure, side effects of the proposed treatment, the likelihood of the patient achieving the goals of the procedure, and any potential problems that might occur during the procedure or recuperation. Informed consent has been obtained.  Description of procedure:  The patient was taken to the operating room and general anesthesia was induced.  The patient was placed in the supine  position, prepped and draped in the usual sterile fashion, and preoperative antibiotics were administered. A preoperative time-out was performed.   At this point time, a thorough exam of the patient's scrotum was performed. This revealed a palpable mass on the left with a possible defect in the mid scrotum consistent with previous vasectomy. On the right, his vasa was very difficult to palpate presumably due to a lipoma of the cord on this side. I was unable to clearly palpate a vasal defect on the side.    Point in time, approximately 4 cm long incision was created over the left  hemiscrotum after instilling 8 cc of half percent lidocaine at the incision site. The incision was carried down through the dartos layer using Bovie electrocautery. The left testicle was then delivered through this small incision and the cord structures were identified. The vas itself was able to be palpated within the cord didn't appear that more proximally there was a possible vas defect. In spite of this, I did go ahead and isolated the vas near the convoluted vas and perform a redo vasectomy on the side. The convoluted vas was completely exposed, a portion of the vas was excised and passed off the field as left vas. The lumen of each of the incised vas deferens was destroyed by inserting the tip of the needle point Bovie into either of the lumen obliterated this space. A fascial interposition was then performed using a 4-0 chromic on the testicular end of the vas. The testicle was then delivered back into its anatomic position after careful hemostasis was achieved. The wound was closed in 2 layers using a 3-0 Vicryl in a running fashion followed by interrupted 3-0 chromic sutures.  Attention was then turned to the right side.  The wound was injected with an additional lidocaine. The incision was then carried down again with a 4 m incision.  Subcutaneous tissues including the tarsus were opened using Bovie electrocautery. The right testicle was then delivered to the field. Cord and cord structures were isolated. The vas was easily palpable on the side and of note, there was a fairly significant lipoma of the cord on this side. The vas was then isolated and skeletonized. No obvious previous vasectomy disruption  was identified on this side although I did not trace the vas proximally into the scrotum. A portion of the vas was then excised and passed off the field labeled as right vas deferens. Each of the lumen were then destroyed using needlepoint cautery within the lumen. A fascial interposition was also  performed on the testicular end of the vas. The disc was then delivered back to the right hemiscrotum after careful hemostasis was achieved and the wound was closed in 2 layers using a running Vicryl in the dartos layer and interrupted chromics and the skin.   The scrotum was then cleaned and dried. The wounds were additionally dressed using Dermabond. Scrotal fluffs and scrotal support was applied. He was then reversed from anesthesia, taken the PACU in stable condition.   Hollice Espy, M.D.

## 2015-08-09 NOTE — Discharge Instructions (Signed)
Vasectomy, Care After Refer to this sheet in the next few weeks. These instructions provide you with information on caring for yourself after your procedure. Your health care provider may also give you more specific instructions. Your treatment has been planned according to current medical practices, but problems sometimes occur. Call your health care provider if you have any problems or questions after your procedure. WHAT TO EXPECT AFTER THE PROCEDURE After your procedure, it is typical to have the following:  Slight swelling or redness or both at the surgical site.  Mild pain or discomfort in the scrotum.  Some oozing of blood from the cuts (incisions) made by the surgeon is normal during the first day or two after the procedure.  Blood in the ejaculate is common and typically clears after a few days. HOME CARE INSTRUCTIONS   Only take over-the-counter or prescription medicines for pain, discomfort, or fever as directed by your health care provider.  Avoid using nonsteroidal anti-inflammatory drugs (NSAIDs) because these can make bleeding worse.  Apply ice to the injured area:  Put ice in a plastic bag.  Place a towel between your skin and the bag.  Leave the ice on for 20 minutes, 2-3 times a day.  Avoid being active for the first 2 days after surgery.  Wear a supporter while moving around for the first week after surgery. You may add some sterile fluffed bandages or a clean washcloth to the scrotal support if the scrotal support irritates your skin.  Do not participate in sports or perform heavy physical labor for at least 2 weeks.  You may have protected intercourse 7-10 days after your procedure. Remember, you are not sterile until follow-up specimens show no sperm in your ejaculate.  Be sure to follow up with your surgeon as instructed to confirm sterility. It usually requires multiple ejaculations to clear the sperm located beyond the vasectomy site of blockage. You will  need at least two specimens showing an absence of sperm before you can resume unprotected intercourse. SEEK MEDICAL CARE IF:   You have redness, swelling, or increasing pain in the wounds or testicles (scrotum).  You see pus coming from the wound.  You have a fever.  You notice a foul smell coming from the wound or dressing.  You notice a breaking open of the stitches (suture) line or wound edges even after sutures have been removed.  You have increased bleeding from the wounds. SEEK IMMEDIATE MEDICAL CARE IF:   You develop a rash.  You have difficulty breathing.  You have any reaction or side effects to medicines given. MAKE SURE YOU:  Understand these instructions.  Will watch your condition.  Will get help right away if you are not doing well or get worse.   This information is not intended to replace advice given to you by your health care provider. Make sure you discuss any questions you have with your health care provider.   Document Released: 10/27/2004 Document Revised: 04/14/2013 Document Reviewed: 10/27/2012 Elsevier Interactive Patient Education 2016 [place]   1) The drugs that you were given will stay in your system until tomorrow so for the next 24 hours you should not:  A) Drive an automobile B) Make any legal decisions C) Drink any alcoholic beverage   2) You may resume regular meals tomorrow.  Today it is better to start with liquids and gradually work up to solid foods.  You may eat anything you prefer, but  it is better to start with liquids, then soup and crackers, and gradually work up to solid foods.   3) Please notify your doctor immediately if you have any unusual bleeding, trouble breathing, redness and pain at the surgery site, drainage, fever, or pain not relieved by medication.    4) Additional Instructions:        Please contact your physician with any problems or Same Day  Surgery at [phone], Monday through Friday 6 am to 4 pm, or [place] [facility] number at [phone].

## 2015-08-09 NOTE — Transfer of Care (Signed)
Immediate Anesthesia Transfer of Care Note  Patient: Cole Austin  Procedure(s) Performed: Procedure(s): VASECTOMY (N/A)  Patient Location: PACU  Anesthesia Type:General  Level of Consciousness: awake, alert , oriented and patient cooperative  Airway & Oxygen Therapy: Patient Spontanous Breathing and Patient connected to nasal cannula oxygen  Post-op Assessment: Report given to RN and Post -op Vital signs reviewed and stable  Post vital signs: Reviewed and stable  Last Vitals:  Filed Vitals:   08/09/15 0941 08/09/15 1218  BP: 128/77 125/88  Pulse: 84 94  Temp:  36.1 C  Resp: 16 16    Complications: No apparent anesthesia complications

## 2015-08-09 NOTE — Anesthesia Preprocedure Evaluation (Addendum)
Anesthesia Evaluation  Patient identified by MRN, date of birth, ID band Patient awake    Reviewed: Allergy & Precautions, NPO status , Patient's Chart, lab work & pertinent test results  Airway Mallampati: II  TM Distance: >3 FB Neck ROM: Full    Dental  (+) Chipped   Pulmonary Current Smoker,    Pulmonary exam normal        Cardiovascular negative cardio ROS Normal cardiovascular exam     Neuro/Psych negative neurological ROS  negative psych ROS   GI/Hepatic negative GI ROS, Neg liver ROS,   Endo/Other  negative endocrine ROS  Renal/GU negative Renal ROS  negative genitourinary   Musculoskeletal Chronic low back pain   Abdominal Normal abdominal exam  (+)   Peds negative pediatric ROS (+)  Hematology negative hematology ROS (+)   Anesthesia Other Findings Possible L foot Fx done recently.. Patient is in a boot splint  Reproductive/Obstetrics                           Anesthesia Physical Anesthesia Plan  ASA: II  Anesthesia Plan: General   Post-op Pain Management:    Induction: Intravenous  Airway Management Planned: LMA  Additional Equipment:   Intra-op Plan:   Post-operative Plan: Extubation in OR  Informed Consent: I have reviewed the patients History and Physical, chart, labs and discussed the procedure including the risks, benefits and alternatives for the proposed anesthesia with the patient or authorized representative who has indicated his/her understanding and acceptance.   Dental advisory given  Plan Discussed with: CRNA and Surgeon  Anesthesia Plan Comments: (Discussed with patient the plan for LMA and also the chances of waking up during surgery.. Patient understands the risks and wishes to proceed.)       Anesthesia Quick Evaluation

## 2015-08-09 NOTE — H&P (View-Only) (Signed)
07/29/2015 5:57 PM   Cole Austin 1975-08-22 [id number]  Referring provider: Juline Patch, MD [address] [place], [place] [address]  Chief Complaint  Patient presents with  . Advice Only    Patient is here for consultation after having vasectomy done. Procedure did not work.    HPI: 40 year old male who underwent vasectomy on 11/11/2014 by Dr. Elnoria Howard. He returned for follow-up semen analysis on 07/06/2015 which showed persistent sperm concerning for failure of the vasectomy. He was asked to return today to discuss the possibility of repeat vasectomy.  He had no issues following his initial procedure.  He does have 4 children and desires no further biological children.   PMH: Past Medical History  Diagnosis Date  . Chronic back pain     Surgical History: Past Surgical History  Procedure Laterality Date  . Leg surgery      relieve pressure in leg  . Shoulder surgery Right     torn ligament/ exploratory  . Mouth wired      Home Medications:    Medication List       This list is accurate as of: 07/29/15  5:57 PM.  Always use your most recent med list.               cyclobenzaprine 10 MG tablet  Commonly known as:  FLEXERIL  Take 1 tablet (10 mg total) by mouth every 8 (eight) hours as needed.        Allergies: No Known Allergies  Family History: Family History  Problem Relation Age of Onset  . Cancer Maternal Uncle   . Heart disease Maternal Grandfather   . Kidney cancer Neg Hx   . Prostate cancer Neg Hx     Social History:  reports that he has been smoking.  His smokeless tobacco use includes Chew. He reports that he drinks alcohol. He reports that he does not use illicit drugs.  ROS: UROLOGY Frequent Urination?: No Hard to postpone urination?: No Burning/pain with urination?: No Get up at night to urinate?: No Leakage of urine?: No Urine stream starts and stops?: No Trouble starting stream?: No Do you have to strain to  urinate?: No Blood in urine?: No Urinary tract infection?: No Sexually transmitted disease?: No Injury to kidneys or bladder?: No Painful intercourse?: No Weak stream?: No Erection problems?: No Penile pain?: No  Gastrointestinal Nausea?: No Vomiting?: No Indigestion/heartburn?: No Diarrhea?: No Constipation?: No  Constitutional Fever: No Night sweats?: No Weight loss?: No Fatigue?: No  Skin Skin rash/lesions?: No Itching?: No  Eyes Blurred vision?: No Double vision?: No  Ears/Nose/Throat Sore throat?: No Sinus problems?: No  Hematologic/Lymphatic Swollen glands?: No Easy bruising?: No  Cardiovascular Leg swelling?: No Chest pain?: No  Respiratory Cough?: No Shortness of breath?: No  Endocrine Excessive thirst?: No  Musculoskeletal Back pain?: No Joint pain?: No  Neurological Headaches?: No Dizziness?: No  Psychologic Depression?: No Anxiety?: No  Physical Exam: BP 115/73 mmHg  Pulse 80  Temp(Src) 98.3 F (36.8 C)  Resp 14  Ht 5\' 8"  (1.727 m)  Wt 197 lb (89.359 kg)  BMI 29.96 kg/m2  Constitutional:  Alert and oriented, No acute distress. HEENT: [place] AT, moist mucus membranes.  Trachea midline, no masses. Cardiovascular: No clubbing, cyanosis, or edema. Respiratory: Normal respiratory effort, no increased work of breathing. GI: Abdomen is soft, nontender, nondistended, no abdominal masses GU: No CVA tenderness. Bilateral descended testicles, normal scrotum. Small midline vasectomy incision noted. Left vas palpable but  somewhat posterior. Vas defect is appreciated. Right vas is difficult to palpate due to possible lipoma of the cord.  Circumcised phallus. Skin: No rashes, bruises or suspicious lesions. Neurologic: Grossly intact, no focal deficits, moving all 4 extremities. Psychiatric: Normal mood and affect.   Assessment & Plan:   1. Vasectomy evaluation S/p vasectomy failure with persistent sperm on SA We discussed that there is a  very low possibility of this and unfortunately his vasectomy seems to have failed. He was offered repeat vasectomy. Given difficulty palpating his right cord, I have advised him to undergo vasectomy redo in the operating room Risk and benefits of procedure were discussed at length today including risk of bleeding, infection, hematoma, edema, vasectomy failure, chronic testicular pain were discussed. Patient is wearing was willing to proceed as planned.  Schedule redo vasectomy in [place], [facility] [address] [place], [place] [address] [phone]

## 2015-08-09 NOTE — Anesthesia Procedure Notes (Signed)
Procedure Name: LMA Insertion Date/Time: 08/09/2015 11:06 AM Performed by: Rosaria Ferries, Brittley Regner Pre-anesthesia Checklist: Patient identified Patient Re-evaluated:Patient Re-evaluated prior to inductionOxygen Delivery Method: Circle system utilized Preoxygenation: Pre-oxygenation with 100% oxygen Intubation Type: IV induction Number of attempts: 1 Placement Confirmation: breath sounds checked- equal and bilateral Tube secured with: Tape Dental Injury: Teeth and Oropharynx as per pre-operative assessment

## 2015-08-10 NOTE — Anesthesia Postprocedure Evaluation (Signed)
Anesthesia Post Note  Patient: Cole Austin  Procedure(s) Performed: Procedure(s) (LRB): VASECTOMY (N/A)  Patient location during evaluation: PACU Anesthesia Type: General Level of consciousness: awake and alert and oriented Pain management: pain level controlled Vital Signs Assessment: post-procedure vital signs reviewed and stable Respiratory status: spontaneous breathing Cardiovascular status: blood pressure returned to baseline Anesthetic complications: no    Last Vitals:  Filed Vitals:   08/09/15 1322 08/09/15 1347  BP: 115/63 126/75  Pulse: 69 86  Temp: 36.1 C   Resp: 16 16    Last Pain:  Filed Vitals:   08/10/15 0829  PainSc: 0-No pain                 Reece Fehnel

## 2015-08-14 LAB — SURGICAL PATHOLOGY

## 2015-10-03 ENCOUNTER — Other Ambulatory Visit: Payer: Self-pay

## 2015-10-03 ENCOUNTER — Telehealth: Payer: Self-pay | Admitting: Urology

## 2015-10-03 NOTE — Telephone Encounter (Signed)
Tried to call the patient on numerous occasions over past two months (on at least 5-6 occasions) and left message on his machine to return call to discuss pathology results.  No vas identified within the surgical specimen on the left. This was the side that the palpable disruption in the vas and I suspect that scar tissue that was excised.    Normal vas excised on right.    He needs to come in for SA to ensure there are no residual sperm.  This is extremely important.    If he calls back, I would like to speak with him personally.   Hollice Espy, MD   Addendum: Spoke with patient. He is aware of the above. He'll provide a semen analysis on Wednesday. Please anticipate his arrival.

## 2015-10-05 ENCOUNTER — Other Ambulatory Visit: Payer: Medicaid Other

## 2015-10-05 DIAGNOSIS — Z9852 Vasectomy status: Secondary | ICD-10-CM

## 2015-10-06 LAB — POST-VAS SPERM EVALUATION,QUAL: Volume: 1.4 mL

## 2015-11-30 ENCOUNTER — Encounter: Payer: Self-pay | Admitting: Family Medicine

## 2015-11-30 ENCOUNTER — Ambulatory Visit (INDEPENDENT_AMBULATORY_CARE_PROVIDER_SITE_OTHER): Payer: Medicaid Other | Admitting: Family Medicine

## 2015-11-30 ENCOUNTER — Ambulatory Visit
Admission: RE | Admit: 2015-11-30 | Discharge: 2015-11-30 | Disposition: A | Discharge status: Home / Self Care | Payer: Medicaid Other | Source: Ambulatory Visit | Attending: Family Medicine | Admitting: Family Medicine

## 2015-11-30 VITALS — BP 120/80 | HR 80 | Ht 68.0 in | Wt 192.0 lb

## 2015-11-30 DIAGNOSIS — M545 Low back pain: Secondary | ICD-10-CM | POA: Diagnosis not present

## 2015-11-30 DIAGNOSIS — M5136 Other intervertebral disc degeneration, lumbar region: Secondary | ICD-10-CM | POA: Diagnosis not present

## 2015-11-30 MED ORDER — TRAMADOL HCL 50 MG PO TABS: 50.0000 mg | ORAL_TABLET | Freq: Three times a day (TID) | ORAL | 0 refills | Status: DC | PRN

## 2015-11-30 MED ORDER — CYCLOBENZAPRINE HCL 10 MG PO TABS: 10.0000 mg | ORAL_TABLET | Freq: Three times a day (TID) | ORAL | 0 refills | Status: DC | PRN

## 2015-11-30 NOTE — Patient Instructions (Signed)

## 2015-11-30 NOTE — Progress Notes (Signed)
Name: Cole Austin   MRN: [id number]    DOB: 09/19/1975   Date:11/30/2015       Progress Note  Subjective  Chief Complaint  Chief Complaint  Patient presents with  . Back Pain    started yesterday after bending over and coming back up- has tried icy hot patches and Flexeril- not helping    Back Pain  This is a chronic problem. The current episode started yesterday. The problem occurs intermittently. The problem has been gradually worsening since onset. The pain is present in the lumbar spine. The quality of the pain is described as aching. The pain radiates to the left thigh. The pain is at a severity of 8/10. The pain is mild. The symptoms are aggravated by bending, coughing, sitting and twisting. Pertinent negatives include no abdominal pain, bladder incontinence, bowel incontinence, chest pain, dysuria, fever, headaches, leg pain, numbness, paresis, paresthesias, pelvic pain, perianal numbness, tingling, weakness or weight loss. Risk factors include recent trauma. The treatment provided mild relief.    No problem-specific Assessment & Plan notes found for this encounter.   Past Medical History:  Diagnosis Date  . Chronic back pain   . Complication of anesthesia    WOKE UP DURING ALL SURGERIES  . Pain    CHRONIC BACK    Past Surgical History:  Procedure Laterality Date  . LEG SURGERY     relieve pressure in leg  . mouth wired    . SHOULDER SURGERY Right    torn ligament/ exploratory  . VASECTOMY N/A 08/09/2015   Procedure: VASECTOMY;  Surgeon: Hollice Espy, MD;  Location: [facility] ORS;  Service: Urology;  Laterality: N/A;  . VASECTOMY      Family History  Problem Relation Age of Onset  . Cancer Maternal Uncle   . Heart disease Maternal Grandfather   . Kidney cancer Neg Hx   . Prostate cancer Neg Hx     Social History   Social History  . Marital status: Married    Spouse name: N/A  . Number of children: N/A  . Years of education: N/A   Occupational History   . Not on file.   Social History Main Topics  . Smoking status: Current Every Day Smoker  . Smokeless tobacco: Former Systems developer    Types: Chew  . Alcohol use No  . Drug use: No  . Sexual activity: Yes   Other Topics Concern  . Not on file   Social History Narrative  . No narrative on file    No Known Allergies   Review of Systems  Constitutional: Negative for chills, fever, malaise/fatigue and weight loss.  HENT: Negative for ear discharge, ear pain and sore throat.   Eyes: Negative for blurred vision.  Respiratory: Negative for cough, sputum production, shortness of breath and wheezing.   Cardiovascular: Negative for chest pain, palpitations and leg swelling.  Gastrointestinal: Negative for abdominal pain, blood in stool, bowel incontinence, constipation, diarrhea, heartburn, melena and nausea.  Genitourinary: Negative for bladder incontinence, dysuria, frequency, hematuria, pelvic pain and urgency.  Musculoskeletal: Positive for back pain. Negative for joint pain, myalgias and neck pain.  Skin: Negative for rash.  Neurological: Negative for dizziness, tingling, sensory change, focal weakness, weakness, numbness, headaches and paresthesias.  Endo/Heme/Allergies: Negative for environmental allergies and polydipsia. Does not bruise/bleed easily.  Psychiatric/Behavioral: Negative for depression and suicidal ideas. The patient is not nervous/anxious and does not have insomnia.      Objective  Vitals:   11/30/15 1136  BP: 120/80  Pulse: 80  Weight: 192 lb (87.1 kg)  Height: 5\' 8"  (1.727 m)    Physical Exam  Constitutional: He is oriented to person, place, and time and well-developed, well-nourished, and in no distress.  HENT:  Head: Normocephalic.  Right Ear: External ear normal.  Left Ear: External ear normal.  Nose: Nose normal.  Mouth/Throat: Oropharynx is clear and moist.  Eyes: Conjunctivae and EOM are normal. Pupils are equal, round, and reactive to light. Right  eye exhibits no discharge. Left eye exhibits no discharge. No scleral icterus.  Neck: Normal range of motion. Neck supple. No JVD present. No tracheal deviation present. No thyromegaly present.  Cardiovascular: Normal rate, regular rhythm, normal heart sounds and intact distal pulses.  Exam reveals no gallop and no friction rub.   No murmur heard. Pulmonary/Chest: Breath sounds normal. No respiratory distress. He has no wheezes. He has no rales.  Abdominal: Soft. Bowel sounds are normal. He exhibits no mass. There is no hepatosplenomegaly. There is no tenderness. There is no rebound, no guarding and no CVA tenderness.  Musculoskeletal: Normal range of motion. He exhibits no edema or tenderness.  Lymphadenopathy:    He has no cervical adenopathy.  Neurological: He is alert and oriented to person, place, and time. He has normal sensation, normal strength, normal reflexes and intact cranial nerves. No cranial nerve deficit.  Skin: Skin is warm. No rash noted.  Psychiatric: Mood and affect normal.  Nursing note and vitals reviewed.     Assessment & Plan  Problem List Items Addressed This Visit    None    Visit Diagnoses    Midline low back pain, with sciatica presence unspecified    -  Primary   Relevant Medications   traMADol (ULTRAM) 50 MG tablet   cyclobenzaprine (FLEXERIL) 10 MG tablet   Other Relevant Orders   DG Lumbar Spine Complete        Dr. Kierstin January [facility]  11/30/15

## 2016-01-25 ENCOUNTER — Ambulatory Visit (INDEPENDENT_AMBULATORY_CARE_PROVIDER_SITE_OTHER): Payer: Medicaid Other | Admitting: Internal Medicine

## 2016-01-25 ENCOUNTER — Encounter: Payer: Self-pay | Admitting: Internal Medicine

## 2016-01-25 VITALS — BP 126/82 | HR 72 | Temp 98.3°F | Resp 16 | Ht 68.0 in | Wt 197.2 lb

## 2016-01-25 DIAGNOSIS — B354 Tinea corporis: Secondary | ICD-10-CM | POA: Diagnosis not present

## 2016-01-25 MED ORDER — MICONAZOLE NITRATE 2 % EX CREA: 1.0000 "application " | TOPICAL_CREAM | Freq: Two times a day (BID) | CUTANEOUS | 1 refills | Status: DC

## 2016-01-25 NOTE — Progress Notes (Signed)
    Date:  01/25/2016   Name:  Cole Austin   DOB:  [birth year]   MRN:  [id number]   Chief Complaint: Tinea (Patient crawls under houses and has a child who has been in contact with the spots on patient. Right arm near outer elbow 2-3 days ago and then Back RUQ came up yesterday and sever itching )  Rash  This is a new problem. The current episode started in the past 7 days. The problem has been gradually worsening since onset. The affected locations include the back and right elbow. The rash is characterized by itchiness. Pertinent negatives include no fatigue or shortness of breath.     Review of Systems  Constitutional: Negative for chills and fatigue.  Respiratory: Negative for choking and shortness of breath.   Cardiovascular: Negative for chest pain.  Skin: Positive for rash.    Patient Active Problem List   Diagnosis Date Noted  . Encounter for general adult medical examination without abnormal findings 10/01/2014  . Low back strain 08/13/2014  . Hx of fracture of rib 08/13/2014    Prior to Admission medications   Medication Sig Start Date End Date Taking? Authorizing Provider  cyclobenzaprine (FLEXERIL) 10 MG tablet Take 1 tablet (10 mg total) by mouth every 8 (eight) hours as needed. 10/04/14  Yes Juline Patch, MD  ibuprofen (ADVIL,MOTRIN) 800 MG tablet Take 1 tablet (800 mg total) by mouth every 8 (eight) hours as needed for mild pain or moderate pain. Patient not taking: Reported on 01/25/2016 08/08/15   Marylene Land, NP    No Known Allergies  Past Surgical History:  Procedure Laterality Date  . LEG SURGERY     relieve pressure in leg  . mouth wired    . SHOULDER SURGERY Right    torn ligament/ exploratory  . VASECTOMY N/A 08/09/2015   Procedure: VASECTOMY;  Surgeon: Hollice Espy, MD;  Location: [facility] ORS;  Service: Urology;  Laterality: N/A;  . VASECTOMY      Social History  Substance Use Topics  . Smoking status: Current Every Day Smoker   Packs/day: 0.50  . Smokeless tobacco: Former Systems developer    Types: Chew  . Alcohol use No     Medication list has been reviewed and updated.   Physical Exam  Constitutional: He is oriented to person, place, and time. He appears well-developed. No distress.  HENT:  Head: Normocephalic and atraumatic.  Pulmonary/Chest: Effort normal. No respiratory distress.  Musculoskeletal: Normal range of motion.  Neurological: He is alert and oriented to person, place, and time.  Skin: Skin is warm and dry. No rash noted.  5 circular scaly lesions c/w tinea on right elbow and 1 lesion on right back.  Largest 1 cm size.  Psychiatric: He has a normal mood and affect. His behavior is normal. Thought content normal.  Nursing note and vitals reviewed.   BP 126/82   Pulse 72   Temp 98.3 F (36.8 C) (Oral)   Resp 16   Ht 5\' 8"  (1.727 m)   Wt 197 lb 3.2 oz (89.4 kg)   BMI 29.98 kg/m   Assessment and Plan: 1. Tinea corporis Apply bid until resolved then several additional days Monitor son for similar lesions - miconazole (MICOTIN) 2 % cream; Apply 1 application topically 2 (two) times daily.  Dispense: 35 g; Refill: [place]ak, MD [facility]  01/25/2016

## 2017-02-05 ENCOUNTER — Ambulatory Visit: Payer: Self-pay | Admitting: Family Medicine

## 2017-02-06 ENCOUNTER — Ambulatory Visit (INDEPENDENT_AMBULATORY_CARE_PROVIDER_SITE_OTHER): Payer: Medicaid Other | Admitting: Family Medicine

## 2017-02-06 ENCOUNTER — Encounter: Payer: Self-pay | Admitting: Family Medicine

## 2017-02-06 VITALS — BP 120/70 | HR 84 | Ht 68.0 in | Wt 182.0 lb

## 2017-02-06 DIAGNOSIS — M7542 Impingement syndrome of left shoulder: Secondary | ICD-10-CM | POA: Diagnosis not present

## 2017-02-06 DIAGNOSIS — Z23 Encounter for immunization: Secondary | ICD-10-CM

## 2017-02-06 MED ORDER — MELOXICAM 15 MG PO TABS: 15.0000 mg | ORAL_TABLET | Freq: Every day | ORAL | 0 refills | Status: DC

## 2017-02-06 MED ORDER — TRAMADOL HCL 50 MG PO TABS: 50.0000 mg | ORAL_TABLET | Freq: Three times a day (TID) | ORAL | 0 refills | Status: DC | PRN

## 2017-02-06 NOTE — Progress Notes (Signed)
Name: Cole Austin   MRN: [id number]    DOB: 1975-10-22   Date:02/06/2017       Progress Note  Subjective  Chief Complaint  Chief Complaint  Patient presents with  . Shoulder Pain    L) shoulder pain- limited ROM and sends pain down arm when extends it. Cannot raise arm above head x 5 days    Shoulder Pain   The pain is present in the left shoulder and left arm. This is a new problem. The current episode started in the past 7 days (onset Friday). There has been no history of extremity trauma (cleaning debris in yard). The problem occurs constantly. The problem has been waxing and waning. The quality of the pain is described as aching. The pain is moderate. Associated symptoms include a limited range of motion. Pertinent negatives include no fever, inability to bear weight, itching, joint locking, joint swelling, numbness, stiffness or tingling. The symptoms are aggravated by activity. He has tried acetaminophen (flexeril/tylenlol) for the symptoms. The treatment provided no relief.    No problem-specific Assessment & Plan notes found for this encounter.   Past Medical History:  Diagnosis Date  . Chronic back pain   . Complication of anesthesia    WOKE UP DURING ALL SURGERIES  . Pain    CHRONIC BACK    Past Surgical History:  Procedure Laterality Date  . LEG SURGERY     relieve pressure in leg  . mouth wired    . SHOULDER SURGERY Right    torn ligament/ exploratory  . VASECTOMY N/A 08/09/2015   Procedure: VASECTOMY;  Surgeon: Hollice Espy, MD;  Location: [facility] ORS;  Service: Urology;  Laterality: N/A;  . VASECTOMY      Family History  Problem Relation Age of Onset  . Cancer Maternal Uncle   . Heart disease Maternal Grandfather   . Kidney cancer Neg Hx   . Prostate cancer Neg Hx     Social History   Social History  . Marital status: Married    Spouse name: N/A  . Number of children: N/A  . Years of education: N/A   Occupational History  . Not on file.    Social History Main Topics  . Smoking status: Current Every Day Smoker    Packs/day: 0.50  . Smokeless tobacco: Former Systems developer    Types: Chew  . Alcohol use No  . Drug use: No  . Sexual activity: Yes   Other Topics Concern  . Not on file   Social History Narrative  . No narrative on file    No Known Allergies  Outpatient Medications Prior to Visit  Medication Sig Dispense Refill  . cyclobenzaprine (FLEXERIL) 10 MG tablet Take 1 tablet (10 mg total) by mouth every 8 (eight) hours as needed. 30 tablet 9  . ibuprofen (ADVIL,MOTRIN) 800 MG tablet Take 1 tablet (800 mg total) by mouth every 8 (eight) hours as needed for mild pain or moderate pain. (Patient not taking: Reported on 01/25/2016) 15 tablet 0  . miconazole (MICOTIN) 2 % cream Apply 1 application topically 2 (two) times daily. 35 g 1   No facility-administered medications prior to visit.     Review of Systems  Constitutional: Negative for chills, fever, malaise/fatigue and weight loss.  HENT: Negative for ear discharge, ear pain and sore throat.   Eyes: Negative for blurred vision.  Respiratory: Negative for cough, sputum production, shortness of breath and wheezing.   Cardiovascular: Negative for chest pain, palpitations and  leg swelling.  Gastrointestinal: Negative for abdominal pain, blood in stool, constipation, diarrhea, heartburn, melena and nausea.  Genitourinary: Negative for dysuria, frequency, hematuria and urgency.  Musculoskeletal: Negative for back pain, joint pain, myalgias, neck pain and stiffness.  Skin: Negative for itching and rash.  Neurological: Negative for dizziness, tingling, sensory change, focal weakness, numbness and headaches.  Endo/Heme/Allergies: Negative for environmental allergies and polydipsia. Does not bruise/bleed easily.  Psychiatric/Behavioral: Negative for depression and suicidal ideas. The patient is not nervous/anxious and does not have insomnia.      Objective  Vitals:    02/06/17 1011  BP: 120/70  Pulse: 84  Weight: 182 lb (82.6 kg)  Height: 5\' 8"  (1.727 m)    Physical Exam  Constitutional: He is oriented to person, place, and time and well-developed, well-nourished, and in no distress.  HENT:  Head: Normocephalic.  Right Ear: External ear normal.  Left Ear: External ear normal.  Nose: Nose normal.  Mouth/Throat: Oropharynx is clear and moist.  Eyes: Pupils are equal, round, and reactive to light. Conjunctivae and EOM are normal. Right eye exhibits no discharge. Left eye exhibits no discharge. No scleral icterus.  Neck: Normal range of motion. Neck supple. No JVD present. No tracheal deviation present. No thyromegaly present.  Cardiovascular: Normal rate, regular rhythm, normal heart sounds and intact distal pulses.  Exam reveals no gallop and no friction rub.   No murmur heard. Pulmonary/Chest: Breath sounds normal. No respiratory distress. He has no wheezes. He has no rales.  Abdominal: Soft. Bowel sounds are normal. He exhibits no mass. There is no hepatosplenomegaly. There is no tenderness. There is no rebound, no guarding and no CVA tenderness.  Musculoskeletal: He exhibits no edema.       Left shoulder: He exhibits decreased range of motion and tenderness.  tendersupraspinatis  Lymphadenopathy:    He has no cervical adenopathy.  Neurological: He is alert and oriented to person, place, and time. He has normal sensation, normal strength, normal reflexes and intact cranial nerves. No cranial nerve deficit.  Skin: Skin is warm. No rash noted.  Psychiatric: Mood and affect normal.  Nursing note and vitals reviewed.     Assessment & Plan  Problem List Items Addressed This Visit    None    Visit Diagnoses    Impingement syndrome of left shoulder    -  Primary   Relevant Medications   meloxicam (MOBIC) 15 MG tablet   traMADol (ULTRAM) 50 MG tablet   Other Relevant Orders   Ambulatory referral to Orthopedic Surgery   Influenza vaccine  needed       Relevant Orders   Flu Vaccine QUAD 36+ mos IM (Completed)      Meds ordered this encounter  Medications  . meloxicam (MOBIC) 15 MG tablet    Sig: Take 1 tablet (15 mg total) by mouth daily.    Dispense:  30 tablet    Refill:  0  . traMADol (ULTRAM) 50 MG tablet    Sig: Take 1 tablet (50 mg total) by mouth every 8 (eight) hours as needed.    Dispense:  30 tablet    Refill:  0      Dr. Shainna Faux [facility]  02/06/17

## 2017-02-06 NOTE — Patient Instructions (Signed)
Shoulder Impingement Syndrome Shoulder impingement syndrome is a condition that causes pain when connective tissues (tendons) surrounding the shoulder joint become pinched. These tendons are part of the group of muscles and tissues that help to stabilize the shoulder (rotator cuff). Beneath the rotator cuff is a fluid-filled sac (bursa) that allows the muscles and tendons to glide smoothly. The bursa may become swollen or irritated (bursitis). Bursitis, swelling in the rotator cuff tendons, or both conditions can decrease how much space is under a bone in the shoulder joint (acromion), resulting in impingement. What are the causes? Shoulder impingement syndrome can be caused by bursitis or swelling of the rotator cuff tendons, which may result from:  Repetitive overhead arm movements.  Falling onto the shoulder.  Weakness in the shoulder muscles.  What increases the risk? You may be more likely to develop this condition if you are an athlete who participates in:  Sports that involve throwing, such as baseball.  Tennis.  Swimming.  Volleyball.  Some people are also more likely to develop impingement syndrome because of the shape of their acromion bone. What are the signs or symptoms? The main symptom of this condition is pain on the front or side of the shoulder. Pain may:  Get worse when lifting or raising the arm.  Get worse at night.  Wake you up from sleeping.  Feel sharp when the shoulder is moved, and then fade to an ache.  Other signs and symptoms may include:  Tenderness.  Stiffness.  Inability to raise the arm above shoulder level or behind the body.  Weakness.  How is this diagnosed? This condition may be diagnosed based on:  Your symptoms.  Your medical history.  A physical exam.  Imaging tests, such as: ? X-rays. ? MRI. ? Ultrasound.  How is this treated? Treatment for this condition may include:  Resting your shoulder and avoiding all  activities that cause pain or put stress on the shoulder.  Icing your shoulder.  NSAIDs to help reduce pain and swelling.  One or more injections of medicines to numb the area and reduce inflammation.  Physical therapy.  Surgery. This may be needed if nonsurgical treatments have not helped. Surgery may involve repairing the rotator cuff, reshaping the acromion, or removing the bursa.  Follow these instructions at home: Managing pain, stiffness, and swelling  If directed, apply ice to the injured area. ? Put ice in a plastic bag. ? Place a towel between your skin and the bag. ? Leave the ice on for 20 minutes, 2-3 times a day. Activity  Rest and return to your normal activities as told by your health care provider. Ask your health care provider what activities are safe for you.  Do exercises as told by your health care provider. General instructions  Do not use any tobacco products, including cigarettes, chewing tobacco, or e-cigarettes. Tobacco can delay healing. If you need help quitting, ask your health care provider.  Ask your health care provider when it is safe for you to drive.  Take over-the-counter and prescription medicines only as told by your health care provider.  Keep all follow-up visits as told by your health care provider. This is important. How is this prevented?  Give your body time to rest between periods of activity.  Be safe and responsible while being active to avoid falls.  Maintain physical fitness, including strength and flexibility. Contact a health care provider if:  Your symptoms have not improved after 1-2 months of treatment and   rest.  You cannot lift your arm away from your body. This information is not intended to replace advice given to you by your health care provider. Make sure you discuss any questions you have with your health care provider. Document Released: 04/09/2005 Document Revised: 12/15/2015 Document Reviewed:  03/12/2015 Elsevier Interactive Patient Education  2018 Elsevier Inc.  

## 2017-02-21 DIAGNOSIS — M25512 Pain in left shoulder: Secondary | ICD-10-CM | POA: Diagnosis not present

## 2017-02-21 DIAGNOSIS — M7552 Bursitis of left shoulder: Secondary | ICD-10-CM | POA: Diagnosis not present

## 2017-03-19 ENCOUNTER — Other Ambulatory Visit: Payer: Self-pay | Admitting: Family Medicine

## 2017-03-19 DIAGNOSIS — M7542 Impingement syndrome of left shoulder: Secondary | ICD-10-CM

## 2017-04-10 ENCOUNTER — Ambulatory Visit: Payer: Medicaid Other | Attending: Orthopedic Surgery | Admitting: Physical Therapy

## 2017-04-10 ENCOUNTER — Encounter: Payer: Self-pay | Admitting: Physical Therapy

## 2017-04-10 DIAGNOSIS — M25512 Pain in left shoulder: Secondary | ICD-10-CM | POA: Insufficient documentation

## 2017-04-10 DIAGNOSIS — M6281 Muscle weakness (generalized): Secondary | ICD-10-CM | POA: Diagnosis present

## 2017-04-10 DIAGNOSIS — M25612 Stiffness of left shoulder, not elsewhere classified: Secondary | ICD-10-CM | POA: Insufficient documentation

## 2017-04-10 NOTE — Therapy (Signed)
[facility] [address] [place], [place], [address] Phone: [phone]   Fax:  [phone]  Physical Therapy Treatment  Patient Details  Name: Cole Austin MRN: [id number] Date of Birth: 03-Aug-1975 Referring Provider: Reche Dixon, PA-C   Encounter Date: 04/10/2017  PT End of Session - 04/10/17 1555    Visit Number  1    Number of Visits  4    Date for PT Re-Evaluation  05/08/17    PT Start Time  0755    PT Stop Time  0846    PT Time Calculation (min)  51 min    Activity Tolerance  Patient tolerated treatment well;Patient limited by pain    Behavior During Therapy  [facility] for tasks assessed/performed       Past Medical History:  Diagnosis Date  . Chronic back pain   . Complication of anesthesia    WOKE UP DURING ALL SURGERIES  . Pain    CHRONIC BACK    Past Surgical History:  Procedure Laterality Date  . LEG SURGERY     relieve pressure in leg  . mouth wired    . SHOULDER SURGERY Right    torn ligament/ exploratory  . VASECTOMY N/A 08/09/2015   Procedure: VASECTOMY;  Surgeon: Hollice Espy, MD;  Location: [facility] ORS;  Service: Urology;  Laterality: N/A;  . VASECTOMY      There were no vitals filed for this visit.  Subjective Assessment - 04/10/17 0756    Subjective  Pt. reports 3/10 L shoulder pain currently at rest and >10/10 pain with overhead reaching/ lifting/ work-related tasks.  Difficulty sleeping at night.      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  Pt. has chronic h/o R shoulder issues/ surgeries and low back pain.      Limitations  Lifting;Standing;House hold activities;Other (comment)    Diagnostic tests  Cortisone injection to L shoulder did not help.      Patient Stated Goals  Decrease L shoulder pain.     Currently in Pain?  Yes    Pain Score  3     Pain Location  Shoulder    Pain Orientation  Left    Pain Descriptors /  Indicators  Aching    Pain Type  Chronic pain    Pain Onset  More than a month ago    Pain Frequency  Constant        L shoulder AROM: flexion 132 deg. (pain).  ER 43 deg. (pain).  IR WNL.   Lifting objects at waist level is fine but anything chest level and above is difficult.    2 surgeries on R shoulder (arthroscope).  Grip: R 112#/ L 104#.  Good L shoulder capsular mobility (AP/PA).      See HEP     PT Education - 04/10/17 9379    Education provided  Yes    Education Details  See HEP    Person(s) Educated  Patient    Methods  Explanation;Demonstration;Handout    Comprehension  Verbalized understanding;Returned demonstration          PT Long Term Goals - 04/10/17 1617      PT LONG TERM GOAL #1   Title  Pt. I with HEP to increase L shoulder AROM to WNL as compared to R shoulder to improve pain-free mobility.      Baseline  Decrease L shoulder  AROM: flexion: 132 deg. (pain). abduction: 128 deg. (pain). ER: 43 deg. (pain). IR WNL.    Time  4    Period  Weeks    Status  New    Target Date  05/08/17      PT LONG TERM GOAL #2   Title  Pt. will increase L shoulder strengthening with flexion/ overhead reaching to grossly 5/5 MMT to improve work-related tasks.      Baseline  L shoulder flexoin 4/5 MMT (pain limited).      Time  4    Period  Weeks    Status  New    Target Date  05/08/17      PT LONG TERM GOAL #3   Title  Pt. will decrease QuickDASH to <25% to improve pain-free UE mobility.      Baseline  QuickDASH on 12/19:  50%.      Time  4    Period  Weeks    Status  New    Target Date  05/08/17      PT LONG TERM GOAL #4   Title  Pt. will report <2/10 L shoulder pain at worst with work-related tasks to improve job demands.      Baseline  L shoulder pain increases to >10/10 with overhead reaching/ lifting.     Time  4    Period  Weeks    Status  New    Target Date  05/08/17            Plan - 04/10/17 1556    Clinical Impression Statement  Pt. is a  41 y/o male with >1 month of L shoulder pain after pulling electrical wiring with L UE.  Pt. reports 3/10 L shoulder pain currently at rest and >10/10 L shoulder pain with overhead reaching/ lifting/ sleeping position.  (+) tenderness with palpation over L supraspinatus/ UT region.  (+) empty can and Neer's test on L.  R shoulder AROM WFL.  Decrease L shoulder AROM: flexion: 132 deg. (pain). abduction: 128 deg. (pain).  ER: 43 deg. (pain).  IR WNL.  B UE muscle strength grossly 5/5 MMT except L shoulder flexion 4/5 MMT (pain provoking).  Pt. will benefit from short-term skilled PT services to increase L shoulder AROM/ stability to improve pain-free mobility.      Clinical Presentation  Stable    Clinical Decision Making  Moderate    Rehab Potential  Good    PT Frequency  1x / week    PT Duration  4 weeks    PT Treatment/Interventions  ADLs/Self Care Home Management;Cryotherapy;Moist Heat;Electrical Stimulation;Therapeutic exercise;Therapeutic activities;Functional mobility training;Neuromuscular re-education;Patient/family education;Manual techniques;Passive range of motion;Dry needling    PT Next Visit Plan  Reassess L shoulder ROM.   Progress HEP as tolerated.      PT Home Exercise Plan  See HEP       Patient will benefit from skilled therapeutic intervention in order to improve the following deficits and impairments:  Pain, Postural dysfunction, Impaired flexibility, Decreased strength, Decreased activity tolerance, Decreased range of motion, Improper body mechanics, Hypomobility  Visit Diagnosis: Acute pain of left shoulder  Shoulder joint stiffness, left  Muscle weakness (generalized)     Problem List Patient Active Problem List   Diagnosis Date Noted  . Encounter for general adult medical examination without abnormal findings 10/01/2014  . Low back strain 08/13/2014  . Hx of fracture of rib 08/13/2014   Pura Spice, PT, DPT # [phone] 04/10/2017, 4:24 PM  [facility] [address] [place], [place], [address] Phone: [phone]   Fax:  [phone]  Name: Cole Austin MRN: [id number] Date of Birth: [birth year]

## 2017-04-18 ENCOUNTER — Ambulatory Visit: Payer: Medicaid Other | Admitting: Physical Therapy

## 2017-04-18 ENCOUNTER — Encounter: Payer: Self-pay | Admitting: Physical Therapy

## 2017-04-18 DIAGNOSIS — M25512 Pain in left shoulder: Secondary | ICD-10-CM

## 2017-04-18 DIAGNOSIS — M25612 Stiffness of left shoulder, not elsewhere classified: Secondary | ICD-10-CM

## 2017-04-18 DIAGNOSIS — M6281 Muscle weakness (generalized): Secondary | ICD-10-CM

## 2017-04-18 NOTE — Therapy (Signed)
[facility] [address] [place], [place], [address] Phone: [phone]   Fax:  [phone]  Physical Therapy Treatment  Patient Details  Name: Cole Austin MRN: [id number] Date of Birth: 05-10-1975 Referring Provider: Reche Dixon, PA-C   Encounter Date: 04/18/2017    Treatment 2 of 4.  Recert date: 2/69/48  Past Medical History:  Diagnosis Date  . Chronic back pain   . Complication of anesthesia    WOKE UP DURING ALL SURGERIES  . Pain    CHRONIC BACK    Past Surgical History:  Procedure Laterality Date  . LEG SURGERY     relieve pressure in leg  . mouth wired    . SHOULDER SURGERY Right    torn ligament/ exploratory  . VASECTOMY N/A 08/09/2015   Procedure: VASECTOMY;  Surgeon: Hollice Espy, MD;  Location: [facility] ORS;  Service: Urology;  Laterality: N/A;  . VASECTOMY      There were no vitals filed for this visit.   Pt. reports significant L shoulder pain (3/10 at rest and 8/10 with overhead reaching).      Treatment:   Manual tx.:  Supine L shoulder AA/PROM all planes (as tolerated). Supine L shoulder AP/PA/inf. Grade II-III mobs. For pain mgmt. Initially (no improvement) and then range.  STM to L anterior/posterior deltoid.    Therex.:  Reviewed HEP.  Seated sh. Pulley ex.  IFC to L shoulder in seated posture at preset sh. Protocol 4.5 mA.  ROM with stim and ended tx. With use of ice.  Trial unit sent home with pt. After he demonstrated proper set-up/use.        Increase L shoulder capsular mobility after AP/inf. mobs. but no improvment in pain.  PT used IFC e-stime to L shoulder during ROM/stretches and loaned unit for home/ work use to pain symptoms.  Tenderness remains along L supraspinatus tendon.    PT Long Term Goals - 04/10/17 1617      PT LONG TERM GOAL #1   Title  Pt. I with HEP to increase L shoulder AROM to WNL as compared to R shoulder to improve pain-free mobility.      Baseline   Decrease L shoulder AROM: flexion: 132 deg. (pain). abduction: 128 deg. (pain). ER: 43 deg. (pain). IR WNL.    Time  4    Period  Weeks    Status  New    Target Date  05/08/17      PT LONG TERM GOAL #2   Title  Pt. will increase L shoulder strengthening with flexion/ overhead reaching to grossly 5/5 MMT to improve work-related tasks.      Baseline  L shoulder flexoin 4/5 MMT (pain limited).      Time  4    Period  Weeks    Status  New    Target Date  05/08/17      PT LONG TERM GOAL #3   Title  Pt. will decrease QuickDASH to <25% to improve pain-free UE mobility.      Baseline  QuickDASH on 12/19:  50%.      Time  4    Period  Weeks    Status  New    Target Date  05/08/17      PT LONG TERM GOAL #4   Title  Pt. will report <2/10 L shoulder pain at worst with work-related tasks to improve job demands.      Baseline  L shoulder pain increases  to >10/10 with overhead reaching/ lifting.     Time  4    Period  Weeks    Status  New    Target Date  05/08/17              Patient will benefit from skilled therapeutic intervention in order to improve the following deficits and impairments:  Pain, Postural dysfunction, Impaired flexibility, Decreased strength, Decreased activity tolerance, Decreased range of motion, Improper body mechanics, Hypomobility  Visit Diagnosis: Acute pain of left shoulder  Shoulder joint stiffness, left  Muscle weakness (generalized)     Problem List Patient Active Problem List   Diagnosis Date Noted  . Encounter for general adult medical examination without abnormal findings 10/01/2014  . Low back strain 08/13/2014  . Hx of fracture of rib 08/13/2014   Pura Spice, PT, DPT # [phone] 04/21/2017, 11:10 AM  [facility] [address] [place], [place], [address] Phone: [phone]   Fax:  [phone]  Name: Cole Austin MRN: [id number] Date of Birth: December 04, 1975

## 2017-04-25 ENCOUNTER — Encounter: Payer: Self-pay | Admitting: Physical Therapy

## 2017-04-25 ENCOUNTER — Ambulatory Visit: Payer: Medicaid Other | Attending: Orthopedic Surgery | Admitting: Physical Therapy

## 2017-04-25 DIAGNOSIS — M6281 Muscle weakness (generalized): Secondary | ICD-10-CM | POA: Insufficient documentation

## 2017-04-25 DIAGNOSIS — M25512 Pain in left shoulder: Secondary | ICD-10-CM | POA: Insufficient documentation

## 2017-04-25 DIAGNOSIS — M25612 Stiffness of left shoulder, not elsewhere classified: Secondary | ICD-10-CM | POA: Diagnosis present

## 2017-04-25 NOTE — Therapy (Signed)
[facility] [address] [place], [place], [address] Phone: [phone]   Fax:  [phone]  Physical Therapy Treatment  Patient Details  Name: Cole Austin MRN: [id number] Date of Birth: [birth year] Referring Provider: Reche Dixon, PA-C   Encounter Date: 04/25/2017  PT End of Session - 04/25/17 0901    Visit Number  3    Number of Visits  4    Date for PT Re-Evaluation  05/08/17    PT Start Time  0902    PT Stop Time  0946    PT Time Calculation (min)  44 min       Past Medical History:  Diagnosis Date  . Chronic back pain   . Complication of anesthesia    WOKE UP DURING ALL SURGERIES  . Pain    CHRONIC BACK    Past Surgical History:  Procedure Laterality Date  . LEG SURGERY     relieve pressure in leg  . mouth wired    . SHOULDER SURGERY Right    torn ligament/ exploratory  . VASECTOMY N/A 08/09/2015   Procedure: VASECTOMY;  Surgeon: Hollice Espy, MD;  Location: [facility] ORS;  Service: Urology;  Laterality: N/A;  . VASECTOMY      There were no vitals filed for this visit.     Pt. continues to c/o persistent L shoulder pain/ tenderness along anterior/ medial deltoid.  Pt. reports 3/10 L sh. pain at rest and >8/10 with work-related tasks and sleeping at night.       QuickDASH: 50% (no improvement).      Treatment:   Manual tx.:  Supine L shoulder AA/PROM all planes (as tolerated). Supine L shoulder AP/PA/inf. Grade II-III mobs. For pain mgmt. Initially (no improvement) and then range.  STM to L anterior/posterior deltoid.    Therex.: Supine L shoulder serratus punches/ reassessment of ROM.  Pain limited/ guarded.  Reviewed HEP.         Pt. has shown limited L shoulder improvement with both ROM and pain.  Pts. pain in L shoulder worsens with overhead reaching/ sleeping posture and palpation along supraspinatus musculature.  (+) tenderness with light palpation over L supraspinatus/ UT region.  (+)  empty can and Neer's test on L.  Limited pt. tolerance with any joint mobilitations/ STM.  Pt. has limited PT visits due to Medicaid restrictions.  PT recommends pt. follow up with Reche Dixon, PA-C to discuss POC.       PT Long Term Goals - 04/10/17 1617      PT LONG TERM GOAL #1   Title  Pt. I with HEP to increase L shoulder AROM to WNL as compared to R shoulder to improve pain-free mobility.      Baseline  Decrease L shoulder AROM: flexion: 132 deg. (pain). abduction: 128 deg. (pain). ER: 43 deg. (pain). IR WNL.    Time  4    Period  Weeks    Status  New    Target Date  05/08/17      PT LONG TERM GOAL #2   Title  Pt. will increase L shoulder strengthening with flexion/ overhead reaching to grossly 5/5 MMT to improve work-related tasks.      Baseline  L shoulder flexoin 4/5 MMT (pain limited).      Time  4    Period  Weeks    Status  New    Target Date  05/08/17      PT LONG TERM GOAL #  3   Title  Pt. will decrease QuickDASH to <25% to improve pain-free UE mobility.      Baseline  QuickDASH on 12/19:  50%.      Time  4    Period  Weeks    Status  New    Target Date  05/08/17      PT LONG TERM GOAL #4   Title  Pt. will report <2/10 L shoulder pain at worst with work-related tasks to improve job demands.      Baseline  L shoulder pain increases to >10/10 with overhead reaching/ lifting.     Time  4    Period  Weeks    Status  New    Target Date  05/08/17            Plan - 04/25/17 0902    Clinical Presentation  Stable    Clinical Decision Making  Moderate    PT Frequency  1x / week    PT Duration  4 weeks    PT Treatment/Interventions  ADLs/Self Care Home Management;Cryotherapy;Moist Heat;Electrical Stimulation;Therapeutic exercise;Therapeutic activities;Functional mobility training;Neuromuscular re-education;Patient/family education;Manual techniques;Passive range of motion;Dry needling    PT Next Visit Plan  Refer pt. back to [employer], PA-C.         Patient  will benefit from skilled therapeutic intervention in order to improve the following deficits and impairments:  Pain, Postural dysfunction, Impaired flexibility, Decreased strength, Decreased activity tolerance, Decreased range of motion, Improper body mechanics, Hypomobility  Visit Diagnosis: Acute pain of left shoulder  Shoulder joint stiffness, left  Muscle weakness (generalized)     Problem List Patient Active Problem List   Diagnosis Date Noted  . Encounter for general adult medical examination without abnormal findings 10/01/2014  . Low back strain 08/13/2014  . Hx of fracture of rib 08/13/2014   Pura Spice, PT, DPT # [id number] 04/25/2017, 9:41 AM  [facility] [address] [place], [place], [address] Phone: [phone]   Fax:  [phone]  Name: Cole Austin MRN: [id number] Date of Birth: 1976/03/30

## 2017-05-02 ENCOUNTER — Other Ambulatory Visit: Payer: Self-pay | Admitting: Orthopedic Surgery

## 2017-05-02 ENCOUNTER — Encounter: Payer: Self-pay | Admitting: Physical Therapy

## 2017-05-02 ENCOUNTER — Ambulatory Visit: Payer: Medicaid Other | Admitting: Physical Therapy

## 2017-05-02 DIAGNOSIS — M25612 Stiffness of left shoulder, not elsewhere classified: Secondary | ICD-10-CM

## 2017-05-02 DIAGNOSIS — M25512 Pain in left shoulder: Secondary | ICD-10-CM

## 2017-05-02 DIAGNOSIS — M6281 Muscle weakness (generalized): Secondary | ICD-10-CM

## 2017-05-02 DIAGNOSIS — M25312 Other instability, left shoulder: Secondary | ICD-10-CM

## 2017-05-05 NOTE — Therapy (Signed)
[facility] [address] [place], [place], [address] Phone: [phone]   Fax:  [phone]  Physical Therapy Treatment  Patient Details  Name: Cole Austin MRN: [id number] Date of Birth: [birth year] Referring Provider: Reche Dixon, PA-C   Encounter Date: 05/02/2017    Treatment 4 of 4.  Recert date: 06/15/95    Past Medical History:  Diagnosis Date  . Chronic back pain   . Complication of anesthesia    WOKE UP DURING ALL SURGERIES  . Pain    CHRONIC BACK    Past Surgical History:  Procedure Laterality Date  . LEG SURGERY     relieve pressure in leg  . mouth wired    . SHOULDER SURGERY Right    torn ligament/ exploratory  . VASECTOMY N/A 08/09/2015   Procedure: VASECTOMY;  Surgeon: Hollice Espy, MD;  Location: [facility] ORS;  Service: Urology;  Laterality: N/A;  . VASECTOMY      There were no vitals filed for this visit.    Pt. contacted Reche Dixon, PA-C about persistent pain in L shoulder.  Pt. is currently prescribed Vicodin (helping better than Tramadol per pt.).  Pt. states he has to try 3 months of conservative tx. before Medicaid with authorize diagnostic imaging.          Treatment:  Manual tx.: Supine L shoulder AA/PROM all planes (as tolerated) with and without use of wand. Supine L shoulder AP/PA/inf. Grade II-III mobs. For pain mgmt.  STM to L anterior/posterior deltoid/ proximal biceps.   Therex.: Supine L shoulder serratus punches/ flexion/ abd./ IR/ ER AROM 5x each in Pain tolerable range.  Seated B UBE 2 min. F/b.   Reviewed HEP.             Pt. has marked increase in L shoulder A/AROM with use of wand in supine position (all planes) since starting Vicodin.  L shoulder tenderness remains present with decrease generalized tenderness noted.  PT has encouarged to remain compliant with HEP and avoid any pain provoking L sh. movement patterns.       PT Long Term Goals - 04/10/17  1617      PT LONG TERM GOAL #1   Title  Pt. I with HEP to increase L shoulder AROM to WNL as compared to R shoulder to improve pain-free mobility.      Baseline  Decrease L shoulder AROM: flexion: 132 deg. (pain). abduction: 128 deg. (pain). ER: 43 deg. (pain). IR WNL.    Time  4    Period  Weeks    Status  New    Target Date  05/08/17      PT LONG TERM GOAL #2   Title  Pt. will increase L shoulder strengthening with flexion/ overhead reaching to grossly 5/5 MMT to improve work-related tasks.      Baseline  L shoulder flexoin 4/5 MMT (pain limited).      Time  4    Period  Weeks    Status  New    Target Date  05/08/17      PT LONG TERM GOAL #3   Title  Pt. will decrease QuickDASH to <25% to improve pain-free UE mobility.      Baseline  QuickDASH on 12/19:  50%.      Time  4    Period  Weeks    Status  New    Target Date  05/08/17      PT LONG TERM GOAL #  4   Title  Pt. will report <2/10 L shoulder pain at worst with work-related tasks to improve job demands.      Baseline  L shoulder pain increases to >10/10 with overhead reaching/ lifting.     Time  4    Period  Weeks    Status  New    Target Date  05/08/17        Patient will benefit from skilled therapeutic intervention in order to improve the following deficits and impairments:  Pain, Postural dysfunction, Impaired flexibility, Decreased strength, Decreased activity tolerance, Decreased range of motion, Improper body mechanics, Hypomobility  Visit Diagnosis: Acute pain of left shoulder  Shoulder joint stiffness, left  Muscle weakness (generalized)     Problem List Patient Active Problem List   Diagnosis Date Noted  . Encounter for general adult medical examination without abnormal findings 10/01/2014  . Low back strain 08/13/2014  . Hx of fracture of rib 08/13/2014   Pura Spice, PT, DPT # [phone] 05/05/2017, 1:17 PM  [facility] [address] [place], [place], [address] Phone: [phone]   Fax:  [phone]  Name: Cole Austin MRN: [id number] Date of Birth: November 05, 1975

## 2017-05-14 ENCOUNTER — Ambulatory Visit
Admission: RE | Admit: 2017-05-14 | Discharge: 2017-05-14 | Disposition: A | Discharge status: Home / Self Care | Payer: Medicaid Other | Source: Ambulatory Visit | Attending: Orthopedic Surgery | Admitting: Orthopedic Surgery

## 2017-05-14 ENCOUNTER — Other Ambulatory Visit: Payer: Self-pay | Admitting: Orthopedic Surgery

## 2017-05-14 DIAGNOSIS — Z1389 Encounter for screening for other disorder: Secondary | ICD-10-CM | POA: Diagnosis present

## 2017-05-14 DIAGNOSIS — M25312 Other instability, left shoulder: Secondary | ICD-10-CM

## 2017-05-14 DIAGNOSIS — M7582 Other shoulder lesions, left shoulder: Secondary | ICD-10-CM | POA: Insufficient documentation

## 2017-05-14 DIAGNOSIS — T1590XA Foreign body on external eye, part unspecified, unspecified eye, initial encounter: Secondary | ICD-10-CM

## 2017-05-16 ENCOUNTER — Ambulatory Visit: Payer: Medicaid Other | Admitting: Physical Therapy

## 2017-05-16 DIAGNOSIS — M25612 Stiffness of left shoulder, not elsewhere classified: Secondary | ICD-10-CM

## 2017-05-16 DIAGNOSIS — M6281 Muscle weakness (generalized): Secondary | ICD-10-CM

## 2017-05-16 DIAGNOSIS — M25512 Pain in left shoulder: Secondary | ICD-10-CM

## 2017-05-18 NOTE — Therapy (Signed)
[facility] [address] [place], [place], [address] Phone: [phone]   Fax:  [phone]  Physical Therapy Treatment  Patient Details  Name: COCHISE DINNEEN MRN: [id number] Date of Birth: 1975/05/27 Referring Provider: Reche Dixon, PA-C   Encounter Date: 05/16/2017  PT End of Session - 05/18/17 1927    Visit Number  5    Number of Visits  9    Date for PT Re-Evaluation  06/13/17    PT Start Time  7902    PT Stop Time  0948    PT Time Calculation (min)  53 min    Activity Tolerance  Patient tolerated treatment well;Patient limited by pain    Behavior During Therapy  [facility] for tasks assessed/performed       Past Medical History:  Diagnosis Date  . Chronic back pain   . Complication of anesthesia    WOKE UP DURING ALL SURGERIES  . Pain    CHRONIC BACK    Past Surgical History:  Procedure Laterality Date  . LEG SURGERY     relieve pressure in leg  . mouth wired    . SHOULDER SURGERY Right    torn ligament/ exploratory  . VASECTOMY N/A 08/09/2015   Procedure: VASECTOMY;  Surgeon: Hollice Espy, MD;  Location: [facility] ORS;  Service: Urology;  Laterality: N/A;  . VASECTOMY      There were no vitals filed for this visit.  Subjective Assessment - 05/18/17 1920    Subjective  Pt. had MRI with clinical impression of moderate tenodisis of Supraspinatus, Infraspinatus, and Subscapularis.  No signs of tears noted.  Pt. states pain meds have been helping.      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  Pt. has chronic h/o R shoulder issues/ surgeries and low back pain.      Limitations  Lifting;Standing;House hold activities;Other (comment)    Diagnostic tests  Cortisone injection to L shoulder did not help.      Patient Stated Goals  Decrease L shoulder pain.     Currently in Pain?  Yes    Pain Score  2     Pain Location  Shoulder    Pain Orientation  Left     Pain Descriptors / Indicators  Aching;Sore    Pain Type  Chronic pain    Pain Onset  More than a month ago    Pain Frequency  Constant           Treatment:  Manual tx.: Supine L shoulder AA/PROM all planes (as tolerated) with and without use of wand. Supine L shoulder AP/PA/inf. Grade II-III mobs. For pain mgmt.  STM to L anterior/posterior deltoid/ proximal biceps.   Therex.:Supine L shoulder serratus punches/ flexion/ abd./ IR/ ER AROM 5x each inPain tolerable range.  Supine L shoulder isometrics 5x 10 sec. Holds x 2.  Reviewed HEP.        PT Long Term Goals - 05/18/17 1939      PT LONG TERM GOAL #1   Title  Pt. I with HEP to increase L shoulder AROM to WNL as compared to R shoulder to improve pain-free mobility.      Baseline  Decrease L shoulder AROM: flexion: 142 deg. (pain). abduction: 132 deg. (pain). ER: 53 deg. (pain). IR WNL.     Time  4    Period  Weeks    Status  Partially Met    Target Date  06/13/17      PT LONG TERM GOAL #2   Title  Pt. will increase L shoulder strengthening with flexion/ overhead reaching to grossly 5/5 MMT to improve work-related tasks.      Baseline  L shoulder flexoin 4/5 MMT (pain limited).      Time  4    Period  Weeks    Status  Not Met    Target Date  06/13/17      PT LONG TERM GOAL #3   Title  Pt. will decrease QuickDASH to <25% to improve pain-free UE mobility.      Baseline  QuickDASH on 12/19:  50%.      Time  4    Period  Weeks    Status  On-going    Target Date  06/13/17      PT LONG TERM GOAL #4   Title  Pt. will report <2/10 L shoulder pain at worst with work-related tasks to improve job demands.      Baseline  L shoulder pain increases to >5/10 with overhead reaching/ lifting.     Time  4    Period  Weeks    Status  Partially Met    Target Date  06/13/17            Plan - 05/18/17 1929    Clinical Impression Statement  Pt. has shown progress towards PT goals.  (+) tenderness with palpation  over L shoulder musculature (generalized)- improved from initial evaluation.  R shoulder AROM WFL.  Decrease L shoulder AROM: flexion: 142 deg. (pain). abduction: 132 deg. (pain). ER: 53 deg. (pain). IR WNL.  B UE muscle strength grossly 5/5 MMT except L shoulder flexion 4/5 MMT (pain provoking).  Pt. will benefit from short-term skilled PT services to increase L shoulder AROM/ stability to improve pain-free mobility.      Clinical Presentation  Evolving    Clinical Decision Making  Moderate    Rehab Potential  Good    PT Frequency  1x / week    PT Duration  4 weeks    PT Treatment/Interventions  ADLs/Self Care Home Management;Cryotherapy;Moist Heat;Electrical Stimulation;Therapeutic exercise;Therapeutic activities;Functional mobility training;Neuromuscular re-education;Patient/family education;Manual techniques;Passive range of motion;Dry needling    PT Next Visit Plan  Check CAID approval.  L shoulder strengthening.      PT Home Exercise Plan  See HEP       Patient will benefit from skilled therapeutic intervention in order to improve the following deficits and impairments:  Pain, Postural dysfunction, Impaired flexibility, Decreased strength, Decreased activity tolerance, Decreased range of motion, Improper body mechanics, Hypomobility  Visit Diagnosis: Acute pain of left shoulder  Shoulder joint stiffness, left  Muscle weakness (generalized)     Problem List Patient Active Problem List   Diagnosis Date Noted  . Encounter for general adult medical examination without abnormal findings 10/01/2014  . Low back strain 08/13/2014  . Hx of fracture of rib 08/13/2014   Pura Spice, PT, DPT # [phone] 05/18/2017, 7:44 PM  [facility] [address] [place], [place], [address] Phone: [phone]   Fax:  [phone]  Name: ZAMAURI NEZ MRN: [id number] Date of Birth: 1976-03-24

## 2017-05-27 ENCOUNTER — Encounter: Payer: Medicaid Other | Admitting: Physical Therapy

## 2017-05-27 ENCOUNTER — Encounter: Payer: Self-pay | Admitting: Physical Therapy

## 2017-05-27 ENCOUNTER — Ambulatory Visit: Payer: Medicaid Other | Attending: Orthopedic Surgery | Admitting: Physical Therapy

## 2017-05-27 DIAGNOSIS — M6281 Muscle weakness (generalized): Secondary | ICD-10-CM | POA: Insufficient documentation

## 2017-05-27 DIAGNOSIS — M25612 Stiffness of left shoulder, not elsewhere classified: Secondary | ICD-10-CM | POA: Diagnosis present

## 2017-05-27 DIAGNOSIS — M25512 Pain in left shoulder: Secondary | ICD-10-CM | POA: Diagnosis not present

## 2017-05-27 NOTE — Therapy (Signed)
[facility] [address] [place], [place], [address] Phone: [phone]   Fax:  [phone]  Physical Therapy Treatment  Patient Details  Name: Cole Austin MRN: [id number] Date of Birth: Mar 07, 1976 Referring Provider: Reche Dixon, PA-C   Encounter Date: 05/27/2017  PT End of Session - 05/27/17 0738    Visit Number  6    Number of Visits  9    Date for PT Re-Evaluation  06/13/17    PT Start Time  0731    PT Stop Time  0825    PT Time Calculation (min)  54 min    Activity Tolerance  Patient tolerated treatment well;Patient limited by pain    Behavior During Therapy  [facility] for tasks assessed/performed       Past Medical History:  Diagnosis Date  . Chronic back pain   . Complication of anesthesia    WOKE UP DURING ALL SURGERIES  . Pain    CHRONIC BACK    Past Surgical History:  Procedure Laterality Date  . LEG SURGERY     relieve pressure in leg  . mouth wired    . SHOULDER SURGERY Right    torn ligament/ exploratory  . VASECTOMY N/A 08/09/2015   Procedure: VASECTOMY;  Surgeon: Hollice Espy, MD;  Location: [facility] ORS;  Service: Urology;  Laterality: N/A;  . VASECTOMY      There were no vitals filed for this visit.  Subjective Assessment - 05/27/17 0736    Subjective  Pt. received another cortisone injection to L shoulder.  Pt. reports 2/10 L shoulder pain currently at rest and states he is more aware of L shoulder use with work-related tasks/ pulling wire.      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  Pt. has chronic h/o R shoulder issues/ surgeries and low back pain.      Limitations  Lifting;Standing;House hold activities;Other (comment)    Diagnostic tests  Cortisone injection to L shoulder did not help.      Patient Stated Goals  Decrease L shoulder pain.     Currently in Pain?  Yes    Pain Score  2     Pain Location  Shoulder    Pain  Orientation  Left    Pain Descriptors / Indicators  Aching;Sore    Pain Type  Chronic pain         Treatment:  Manual tx.: Supine L shoulder AA/PROM all planes (as tolerated) with and without use of wand. Supine L shoulder AP/PA/inf. Grade II-III mobs. For pain mgmt.  STM to L anterior/posterior deltoid/ proximal biceps.   Therex.:Seated L shoulder flexion/ abd./ IR/ ER AROM 5x each inPaintolerable range. Supine L shoulder isometrics 5x 10 sec. Holds x 2 (all planes).  See isometric HEP.   ICE to L shoulder in sitting after tx. Session.  No c/o pain reported.     PT Long Term Goals - 05/18/17 1939      PT LONG TERM GOAL #1   Title  Pt. I with HEP to increase L shoulder AROM to WNL as compared to R shoulder to improve pain-free mobility.      Baseline  Decrease L shoulder AROM: flexion: 142 deg. (pain). abduction: 132 deg. (pain). ER: 53 deg. (pain). IR WNL.     Time  4    Period  Weeks    Status  Partially Met  Target Date  06/13/17      PT LONG TERM GOAL #2   Title  Pt. will increase L shoulder strengthening with flexion/ overhead reaching to grossly 5/5 MMT to improve work-related tasks.      Baseline  L shoulder flexoin 4/5 MMT (pain limited).      Time  4    Period  Weeks    Status  Not Met    Target Date  06/13/17      PT LONG TERM GOAL #3   Title  Pt. will decrease QuickDASH to <25% to improve pain-free UE mobility.      Baseline  QuickDASH on 12/19:  50%.      Time  4    Period  Weeks    Status  On-going    Target Date  06/13/17      PT LONG TERM GOAL #4   Title  Pt. will report <2/10 L shoulder pain at worst with work-related tasks to improve job demands.      Baseline  L shoulder pain increases to >5/10 with overhead reaching/ lifting.     Time  4    Period  Weeks    Status  Partially Met    Target Date  06/13/17            Plan - 05/27/17 1761    Clinical Impression Statement  Pt. presents with less overall L shoulder pain and  tenderness during manual tx. session.  L shoulder ER has muscle weakness noted during manual isometrics.  PT reinforced importance of L UE isometrics with pain-free resistance and reviewed HEP/proper technique/ rest/ ice.  Good L shoulder AROM all planes at end of tx. session.      Clinical Presentation  Evolving    Clinical Decision Making  Moderate    Rehab Potential  Good    PT Frequency  1x / week    PT Duration  4 weeks    PT Treatment/Interventions  ADLs/Self Care Home Management;Cryotherapy;Moist Heat;Electrical Stimulation;Therapeutic exercise;Therapeutic activities;Functional mobility training;Neuromuscular re-education;Patient/family education;Manual techniques;Passive range of motion;Dry needling    PT Next Visit Plan  Focus on L shoulder strengthening/ pain-free mobility.      PT Home Exercise Plan  See HEP       Patient will benefit from skilled therapeutic intervention in order to improve the following deficits and impairments:  Pain, Postural dysfunction, Impaired flexibility, Decreased strength, Decreased activity tolerance, Decreased range of motion, Improper body mechanics, Hypomobility  Visit Diagnosis: Acute pain of left shoulder  Shoulder joint stiffness, left  Muscle weakness (generalized)     Problem List Patient Active Problem List   Diagnosis Date Noted  . Encounter for general adult medical examination without abnormal findings 10/01/2014  . Low back strain 08/13/2014  . Hx of fracture of rib 08/13/2014   Pura Spice, PT, DPT # [phone] 05/27/2017, 8:36 AM  [place] [address] [place], [place], [address] Phone: [phone]   Fax:  [phone]  Name: Cole Austin MRN: [id number] Date of Birth: Aug 25, 1975

## 2017-06-03 ENCOUNTER — Encounter: Payer: Medicaid Other | Admitting: Physical Therapy

## 2017-06-04 ENCOUNTER — Encounter: Payer: Self-pay | Admitting: Physical Therapy

## 2017-06-04 ENCOUNTER — Ambulatory Visit: Payer: Medicaid Other | Admitting: Physical Therapy

## 2017-06-04 DIAGNOSIS — M25512 Pain in left shoulder: Secondary | ICD-10-CM | POA: Diagnosis not present

## 2017-06-04 DIAGNOSIS — M6281 Muscle weakness (generalized): Secondary | ICD-10-CM

## 2017-06-04 DIAGNOSIS — M25612 Stiffness of left shoulder, not elsewhere classified: Secondary | ICD-10-CM

## 2017-06-04 NOTE — Therapy (Signed)
[facility] [address] [place], [place], [address] Phone: [phone]   Fax:  [phone]  Physical Therapy Treatment  Patient Details  Name: Cole Austin MRN: [id number] Date of Birth: Mar 09, 1976 Referring Provider: Reche Dixon, PA-C   Encounter Date: 06/04/2017    Treatment 7 of 9.  Recert date: 07/20/05   Past Medical History:  Diagnosis Date  . Chronic back pain   . Complication of anesthesia    WOKE UP DURING ALL SURGERIES  . Pain    CHRONIC BACK    Past Surgical History:  Procedure Laterality Date  . LEG SURGERY     relieve pressure in leg  . mouth wired    . SHOULDER SURGERY Right    torn ligament/ exploratory  . VASECTOMY N/A 08/09/2015   Procedure: VASECTOMY;  Surgeon: Hollice Espy, MD;  Location: [facility] ORS;  Service: Urology;  Laterality: N/A;  . VASECTOMY      There were no vitals filed for this visit.    Pt. states shoulder is doing okay. No c/o pain at this time but pt. states he is really tired this morning. Pt. didn't sleep well last night.        Treatment:  Therex.:Seated UBE 2.5 min. F/b (warm-up/no charge).  Standing bodyblade (multiple planes of mvmt. Bilateral/ Unilateral with L)- 30 sec. Each.  Standing scapular retraction with GTBSupine L shoulder isometrics 5x 10 sec. Holds x 2 (all planes). See new HEP (GTB).  Supine serratus punches.  Manual tx.: Supine L shoulder AP/PA/inf. Grade II-III mobs. For pain mgmt.  STM to L anterior/posterior deltoid/ proximal biceps.    ICE to L shoulder in sitting after tx. Session.  No c/o pain reported.       Pt. continues to increase L shoulder ROM/ strengthening and PT progressed pt. to more resistive strengthening ex. Pt. issued theraband and postural/ shoulder strengthening ex. program. Good technique with no increase c/o L shoulder pain. PT instructed pt. to continue with icing and avoid any pain provoking L shoulder movement  patterns.    PT Long Term Goals - 05/18/17 1939      PT LONG TERM GOAL #1   Title  Pt. I with HEP to increase L shoulder AROM to WNL as compared to R shoulder to improve pain-free mobility.      Baseline  Decrease L shoulder AROM: flexion: 142 deg. (pain). abduction: 132 deg. (pain). ER: 53 deg. (pain). IR WNL.     Time  4    Period  Weeks    Status  Partially Met    Target Date  06/13/17      PT LONG TERM GOAL #2   Title  Pt. will increase L shoulder strengthening with flexion/ overhead reaching to grossly 5/5 MMT to improve work-related tasks.      Baseline  L shoulder flexoin 4/5 MMT (pain limited).      Time  4    Period  Weeks    Status  Not Met    Target Date  06/13/17      PT LONG TERM GOAL #3   Title  Pt. will decrease QuickDASH to <25% to improve pain-free UE mobility.      Baseline  QuickDASH on 12/19:  50%.      Time  4    Period  Weeks    Status  On-going    Target Date  06/13/17      PT LONG TERM GOAL #4  Title  Pt. will report <2/10 L shoulder pain at worst with work-related tasks to improve job demands.      Baseline  L shoulder pain increases to >5/10 with overhead reaching/ lifting.     Time  4    Period  Weeks    Status  Partially Met    Target Date  06/13/17         Patient will benefit from skilled therapeutic intervention in order to improve the following deficits and impairments:  Pain, Postural dysfunction, Impaired flexibility, Decreased strength, Decreased activity tolerance, Decreased range of motion, Improper body mechanics, Hypomobility  Visit Diagnosis: Acute pain of left shoulder  Shoulder joint stiffness, left  Muscle weakness (generalized)     Problem List Patient Active Problem List   Diagnosis Date Noted  . Encounter for general adult medical examination without abnormal findings 10/01/2014  . Low back strain 08/13/2014  . Hx of fracture of rib 08/13/2014   Pura Spice, PT, DPT # [id number] 06/05/2017, 6:11 PM  [facility] [address] [place], [place], [address] Phone: [phone]   Fax:  [phone]  Name: Cole Austin MRN: [id number] Date of Birth: [birth year]

## 2017-06-10 ENCOUNTER — Encounter: Payer: Medicaid Other | Admitting: Physical Therapy

## 2017-06-11 ENCOUNTER — Ambulatory Visit: Payer: Medicaid Other | Admitting: Physical Therapy

## 2017-06-11 ENCOUNTER — Encounter: Payer: Self-pay | Admitting: Physical Therapy

## 2017-06-11 DIAGNOSIS — M25512 Pain in left shoulder: Secondary | ICD-10-CM | POA: Diagnosis not present

## 2017-06-11 DIAGNOSIS — M6281 Muscle weakness (generalized): Secondary | ICD-10-CM

## 2017-06-11 DIAGNOSIS — M25612 Stiffness of left shoulder, not elsewhere classified: Secondary | ICD-10-CM

## 2017-06-11 NOTE — Therapy (Signed)
[facility] [address] [place], [place], [address] Phone: [phone]   Fax:  [phone]  Physical Therapy Treatment  Patient Details  Name: Cole Austin MRN: [id number] Date of Birth: [birth year] Referring Provider: Reche Dixon, PA-C   Encounter Date: 06/11/2017  PT End of Session - 06/11/17 1743    Visit Number  8    Number of Visits  9    Date for PT Re-Evaluation  06/13/17    PT Start Time  0733    PT Stop Time  0832    PT Time Calculation (min)  59 min    Activity Tolerance  Patient tolerated treatment well    Behavior During Therapy  [facility] for tasks assessed/performed       Past Medical History:  Diagnosis Date  . Chronic back pain   . Complication of anesthesia    WOKE UP DURING ALL SURGERIES  . Pain    CHRONIC BACK    Past Surgical History:  Procedure Laterality Date  . LEG SURGERY     relieve pressure in leg  . mouth wired    . SHOULDER SURGERY Right    torn ligament/ exploratory  . VASECTOMY N/A 08/09/2015   Procedure: VASECTOMY;  Surgeon: Hollice Espy, MD;  Location: [facility] ORS;  Service: Urology;  Laterality: N/A;  . VASECTOMY      There were no vitals filed for this visit.  Subjective Assessment - 06/11/17 1741    Subjective  Pt. states he is really sore in B shoulders due to using sledgehammer at work yesterday for 20+ minutes.    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  Pt. has chronic h/o R shoulder issues/ surgeries and low back pain.      Limitations  Lifting;Standing;House hold activities;Other (comment)    Diagnostic tests  Cortisone injection to L shoulder did not help.      Patient Stated Goals  Decrease L shoulder pain.     Currently in Pain?  No/denies         Treatment:  Therex.:Discuss GTBHEP.  Supine B shoulder AAROM with wand (flexion/ abd.)- warm-up Prone L shoulder only off mat table (Y,T,I's) 10x2  each. Standing Nautilus: 40# scap. Retraction/ tricep extension with wand/ B sh. Adduction with handles 20x each.  Manual tx.: Supine L shoulder AP/PA/inf. Grade II-III mobs. For pain mgmt./ mobility. STM to L anterior/posterior deltoid/ proximal biceps/ lateral pec.    ICE to L shoulder in sitting after tx. Session. No c/o pain reported.     PT Long Term Goals - 05/18/17 1939      PT LONG TERM GOAL #1   Title  Pt. I with HEP to increase L shoulder AROM to WNL as compared to R shoulder to improve pain-free mobility.      Baseline  Decrease L shoulder AROM: flexion: 142 deg. (pain). abduction: 132 deg. (pain). ER: 53 deg. (pain). IR WNL.     Time  4    Period  Weeks    Status  Partially Met    Target Date  06/13/17      PT LONG TERM GOAL #2   Title  Pt. will increase L shoulder strengthening with flexion/ overhead reaching to grossly 5/5 MMT to improve work-related tasks.      Baseline  L shoulder flexoin 4/5 MMT (pain limited).      Time  4  Period  Weeks    Status  Not Met    Target Date  06/13/17      PT LONG TERM GOAL #3   Title  Pt. will decrease QuickDASH to <25% to improve pain-free UE mobility.      Baseline  QuickDASH on 12/19:  50%.      Time  4    Period  Weeks    Status  On-going    Target Date  06/13/17      PT LONG TERM GOAL #4   Title  Pt. will report <2/10 L shoulder pain at worst with work-related tasks to improve job demands.      Baseline  L shoulder pain increases to >5/10 with overhead reaching/ lifting.     Time  4    Period  Weeks    Status  Partially Met    Target Date  06/13/17            Plan - 06/11/17 1744    Clinical Impression Statement  L shoulder AROM WFL with pain/discomfort reported at end range.  Several episodes of L shoulder crepitus/ catching during AAROM/ manual tx.  Good tx. tolerance with inf./ AP/ PA mobs. to L proximal shoulder.  Minimal tenderness noted during STM.  PT limited L shoulder strengthening progression  today secondary to increase c/o soreness.       Clinical Presentation  Evolving    Clinical Decision Making  Moderate    Rehab Potential  Good    PT Frequency  1x / week    PT Treatment/Interventions  ADLs/Self Care Home Management;Cryotherapy;Moist Heat;Electrical Stimulation;Therapeutic exercise;Therapeutic activities;Functional mobility training;Neuromuscular re-education;Patient/family education;Manual techniques;Passive range of motion;Dry needling    PT Next Visit Plan  Focus on L shoulder strengthening/ pain-free mobility.  Check goals/RECERT vs. DISCHARGE next tx.        PT Home Exercise Plan  See HEP       Patient will benefit from skilled therapeutic intervention in order to improve the following deficits and impairments:  Pain, Postural dysfunction, Impaired flexibility, Decreased strength, Decreased activity tolerance, Decreased range of motion, Improper body mechanics, Hypomobility  Visit Diagnosis: Acute pain of left shoulder  Shoulder joint stiffness, left  Muscle weakness (generalized)     Problem List Patient Active Problem List   Diagnosis Date Noted  . Encounter for general adult medical examination without abnormal findings 10/01/2014  . Low back strain 08/13/2014  . Hx of fracture of rib 08/13/2014   Pura Spice, PT, DPT # [phone] 06/11/2017, 5:51 PM  [facility] [address] [place], [place], [address] Phone: [phone]   Fax:  [phone]  Name: ARIYON MITTLEMAN MRN: [id number] Date of Birth: 03/26/1976

## 2017-06-17 ENCOUNTER — Encounter: Payer: Medicaid Other | Admitting: Physical Therapy

## 2017-06-18 ENCOUNTER — Ambulatory Visit: Payer: Medicaid Other | Admitting: Physical Therapy

## 2017-11-26 ENCOUNTER — Ambulatory Visit
Admission: EM | Admit: 2017-11-26 | Discharge: 2017-11-26 | Disposition: A | Discharge status: Home / Self Care | Payer: Medicaid Other | Attending: Family Medicine | Admitting: Family Medicine

## 2017-11-26 ENCOUNTER — Encounter: Payer: Self-pay | Admitting: Emergency Medicine

## 2017-11-26 ENCOUNTER — Other Ambulatory Visit: Payer: Self-pay

## 2017-11-26 DIAGNOSIS — L600 Ingrowing nail: Secondary | ICD-10-CM

## 2017-11-26 DIAGNOSIS — M79675 Pain in left toe(s): Secondary | ICD-10-CM | POA: Diagnosis not present

## 2017-11-26 DIAGNOSIS — L02612 Cutaneous abscess of left foot: Secondary | ICD-10-CM | POA: Diagnosis not present

## 2017-11-26 MED ORDER — MUPIROCIN 2 % EX OINT: 1.0000 "application " | TOPICAL_OINTMENT | Freq: Three times a day (TID) | CUTANEOUS | 0 refills | Status: DC

## 2017-11-26 MED ORDER — DOXYCYCLINE HYCLATE 100 MG PO CAPS: 100.0000 mg | ORAL_CAPSULE | Freq: Two times a day (BID) | ORAL | 0 refills | Status: DC

## 2017-11-26 NOTE — Discharge Instructions (Addendum)
Apply warm compresses to the toe  as we discussed 3-4 times daily for 10 minutes each time.  After 10 minutes ,dry thoroughly and apply mupirocin ointment to the nail fold as we discussed.  Twice daily use dental floss to gently lift the toenail to grow out properly.  Take all of the oral doxycycline.  If the toenail has not improved with in 2 weeks follow-up with the podiatrist.

## 2017-11-26 NOTE — ED Provider Notes (Signed)
[facility]    CSN: [id number] Arrival date & time: 11/26/17  1834     History   Chief Complaint Chief Complaint  Patient presents with  . Ingrown Toenail    HPI Cole Austin is a 42 y.o. male.   HPI  42 year old male presents with a ingrown toenail on the left great toe that has had for 2 to 3 days.  The patient states that he has been digging to try and "get the nail out" has only worsened.  Today he  brushed against something and had increasing pain.  Just recently initiate taking care of his right ingrown toenail but this 1 was not able to have the same results.  No fever or chills.        Past Medical History:  Diagnosis Date  . Chronic back pain   . Complication of anesthesia    WOKE UP DURING ALL SURGERIES  . Pain    CHRONIC BACK    Patient Active Problem List   Diagnosis Date Noted  . Encounter for general adult medical examination without abnormal findings 10/01/2014  . Low back strain 08/13/2014  . Hx of fracture of rib 08/13/2014    Past Surgical History:  Procedure Laterality Date  . LEG SURGERY     relieve pressure in leg  . mouth wired    . SHOULDER SURGERY Right    torn ligament/ exploratory  . VASECTOMY N/A 08/09/2015   Procedure: VASECTOMY;  Surgeon: Hollice Espy, MD;  Location: [facility] ORS;  Service: Urology;  Laterality: N/A;  . VASECTOMY         Home Medications    Prior to Admission medications   Medication Sig Start Date End Date Taking? Authorizing Provider  doxycycline (VIBRAMYCIN) 100 MG capsule Take 1 capsule (100 mg total) by mouth 2 (two) times daily. 11/26/17   Lorin Picket, PA-C  mupirocin ointment (BACTROBAN) 2 % Apply 1 application topically 3 (three) times daily. 11/26/17   Lorin Picket, PA-C    Family History Family History  Problem Relation Age of Onset  . Cancer Maternal Uncle   . Heart disease Maternal Grandfather   . Hypertension Mother   . Hypertension Father   . Kidney cancer Neg  Hx   . Prostate cancer Neg Hx     Social History Social History   Tobacco Use  . Smoking status: Current Every Day Smoker    Packs/day: 1.00  . Smokeless tobacco: Former Systems developer    Types: Chew  Substance Use Topics  . Alcohol use: No    Alcohol/week: 0.0 oz  . Drug use: No     Allergies   Patient has no known allergies.   Review of Systems Review of Systems  Constitutional: Positive for activity change. Negative for appetite change, chills, fatigue and fever.  Skin: Positive for color change and wound.  All other systems reviewed and are negative.    Physical Exam Triage Vital Signs ED Triage Vitals [11/26/17 1843]  Enc Vitals Group     BP (!) 141/96     Pulse Rate 85     Resp 16     Temp 98.2 F (36.8 C)     Temp Source Oral     SpO2 99 %     Weight 192 lb (87.1 kg)     Height 5\' 8"  (1.727 m)     Head Circumference      Peak Flow      Pain Score 4  Pain Loc      Pain Edu?      Excl. in [place]?    No data found.  Updated Vital Signs BP (!) 141/96 (BP Location: Left Arm)   Pulse 85   Temp 98.2 F (36.8 C) (Oral)   Resp 16   Ht 5\' 8"  (1.727 m)   Wt 192 lb (87.1 kg)   SpO2 99%   BMI 29.19 kg/m   Visual Acuity Right Eye Distance:   Left Eye Distance:   Bilateral Distance:    Right Eye Near:   Left Eye Near:    Bilateral Near:     Physical Exam  Constitutional: He is oriented to person, place, and time. He appears well-developed and well-nourished. No distress.  HENT:  Head: Normocephalic.  Eyes: Pupils are equal, round, and reactive to light. Right eye exhibits no discharge. Left eye exhibits no discharge.  Neck: Normal range of motion.  Musculoskeletal: Normal range of motion. He exhibits edema and tenderness.  Neurological: He is alert and oriented to person, place, and time.  Skin: Skin is warm and dry. He is not diaphoretic. There is erythema.  Examination of the left great toenail medial aspect shows warm erythematous  nail fold.  It is  fluctuant.  Has under lying in duration.  Patient has trimmed the nail diagonally and attempt to "remove" the ingrown portion.  Psychiatric: He has a normal mood and affect. His behavior is normal. Judgment and thought content normal.  Nursing note and vitals reviewed.    UC Treatments / Results  Labs (all labs ordered are listed, but only abnormal results are displayed) Labs Reviewed - No data to display  EKG None  Radiology No results found.  Procedures Incision and Drainage Date/Time: 11/26/2017 7:45 PM Performed by: Lorin Picket, PA-C Authorized by: Coral Spikes, DO   Consent:    Consent obtained:  Verbal   Consent given by:  Patient   Risks discussed:  Bleeding and incomplete drainage   Alternatives discussed:  Referral Location:    Type:  Abscess   Size:  1x1   Location:  Lower extremity   Lower extremity location:  Foot   Foot location:  L foot Pre-procedure details:    Skin preparation:  Hibiclens Anesthesia (see MAR for exact dosages):    Anesthesia method:  Topical application Procedure details:    Needle aspiration: yes     Needle size:  18 G   Incision types:  Stab incision   Incision depth:  Dermal   Wound management:  Probed and deloculated   Drainage:  Bloody   Drainage amount:  Scant   Wound treatment:  Wound left open   Packing materials:  None Post-procedure details:    Patient tolerance of procedure:  Tolerated well, no immediate complications   (including critical care time)  Medications Ordered in UC Medications - No data to display  Initial Impression / Assessment and Plan / UC Course  I have reviewed the triage vital signs and the nursing notes.  Pertinent labs & imaging results that were available during my care of the patient were reviewed by me and considered in my medical decision making (see chart for details).     Plan: 1. Test/x-ray results and diagnosis reviewed with patient 2. rx as per orders; risks, benefits,  potential side effects reviewed with patient 3. Recommend supportive treatment with warm compresses 3 times daily followed by application of Bactroban ointment.  Also start him on doxycycline twice  daily for 5 days.  Is not improved in a week or 2 should follow-up with the podiatry. 4. F/u prn if symptoms worsen or don't improve  Final Clinical Impressions(s) / UC Diagnoses   Final diagnoses:  Ingrown left greater toenail     Discharge Instructions     Apply warm compresses to the toe  as we discussed 3-4 times daily for 10 minutes each time.  After 10 minutes ,dry thoroughly and apply mupirocin ointment to the nail fold as we discussed.  Twice daily use dental floss to gently lift the toenail to grow out properly.  Take all of the oral doxycycline.  If the toenail has not improved with in 2 weeks follow-up with the podiatrist.   ED Prescriptions    Medication Sig Dispense Auth. Provider   mupirocin ointment (BACTROBAN) 2 % Apply 1 application topically 3 (three) times daily. 22 g Crecencio Mc P, PA-C   doxycycline (VIBRAMYCIN) 100 MG capsule Take 1 capsule (100 mg total) by mouth 2 (two) times daily. 10 capsule Lorin Picket, PA-C     Controlled Substance Prescriptions [place] Controlled Substance Registry consulted? Not Applicable   Lorin Picket, PA-C 11/26/17 1952

## 2017-11-26 NOTE — ED Triage Notes (Signed)
Patient in today c/o ingrown toenail on left great toe x 2-3 days.

## 2017-12-02 ENCOUNTER — Telehealth: Payer: Self-pay

## 2017-12-02 DIAGNOSIS — L03032 Cellulitis of left toe: Secondary | ICD-10-CM | POA: Diagnosis not present

## 2017-12-02 DIAGNOSIS — L600 Ingrowing nail: Secondary | ICD-10-CM | POA: Diagnosis not present

## 2017-12-02 NOTE — Telephone Encounter (Signed)
Pt was seen in urgent care on 11/26/2017 for ingrown toenail- called stating toe is turning purple and black, needs appt with podiatry quicker than a month. Called and got pt in for today at 10:15 at [facility] in [place]. Called pt and told him to be at [facility] in [place] this morning at 10:00 for a 10:15 appt

## 2018-05-03 IMAGING — MR MR SHOULDER*L* W/O CM
5 series · 40 of 40 positions shown · non-contrast
Comparison: None.

CLINICAL DATA: Pulling injury 3 months ago. Decreased range of
motion.

EXAM:
MRI OF THE LEFT SHOULDER WITHOUT CONTRAST
TECHNIQUE: Multiplanar, multisequence MR imaging of the shoulder was performed.
No intravenous contrast was administered.

[Series 4: T2 fat-sat · axial · 4.0mm · 0.62mm/px · z∈[-26,+75]mm · 10 of 24 slices shown (1 of 3)]
[im 1/24]
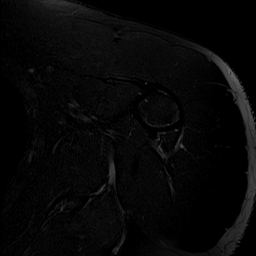
[im 3/24]
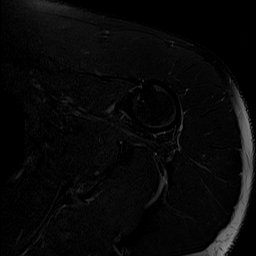
[im 6/24]
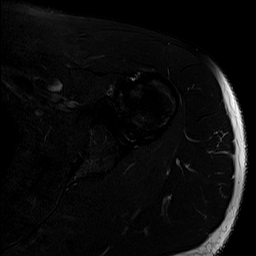
[im 8/24]
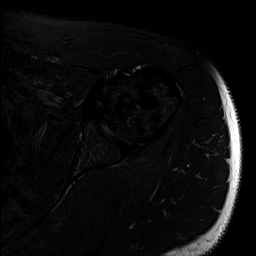
[im 11/24]
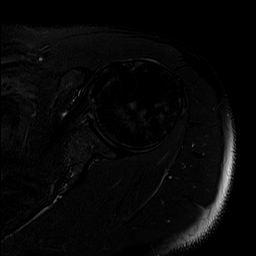
[im 13/24]
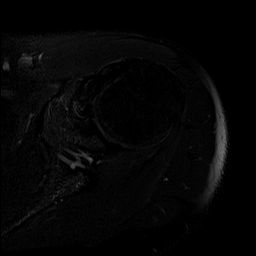
[im 16/24]
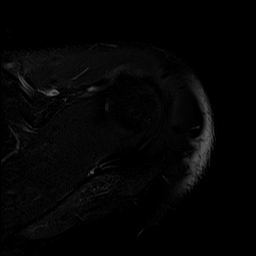
[im 18/24]
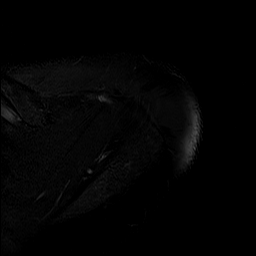
[im 21/24]
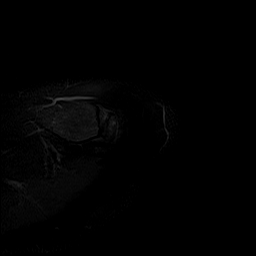
[im 24/24]
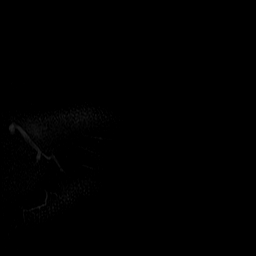

[Series 5: T2 fat-sat · oblique · 4.0mm · 0.59mm/px · 7 of 18 slices shown (2 of 3)]
[im 1/18]
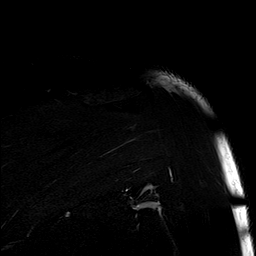
[im 3/18]
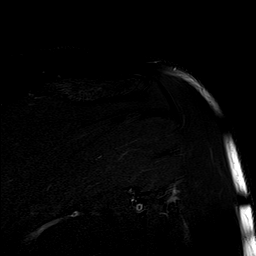
[im 6/18]
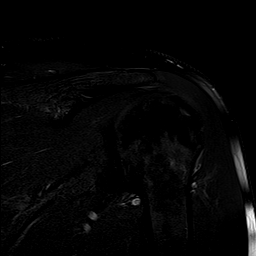
[im 9/18]
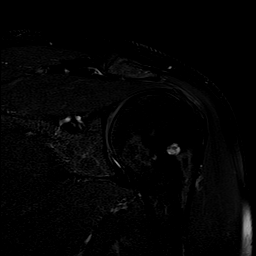
[im 12/18]
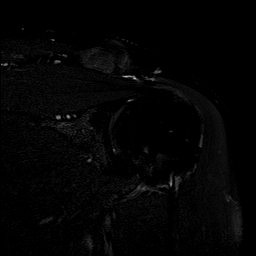
[im 15/18]
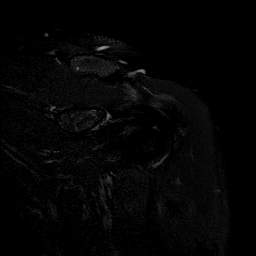
[im 18/18]
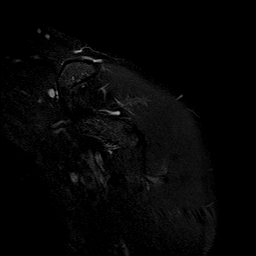

[Series 6: PD · oblique · 4.0mm · 0.59mm/px · 7 of 18 slices shown]
[im 1/18]
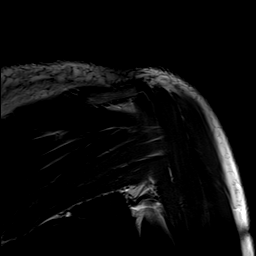
[im 3/18]
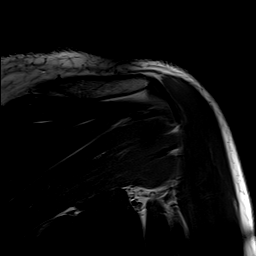
[im 6/18]
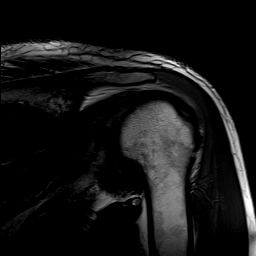
[im 9/18]
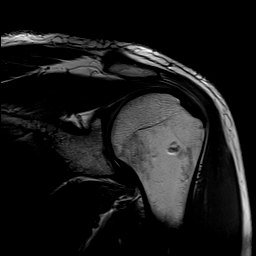
[im 12/18]
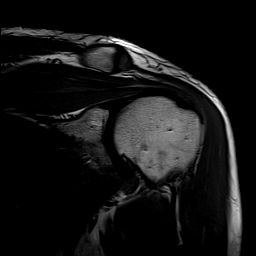
[im 15/18]
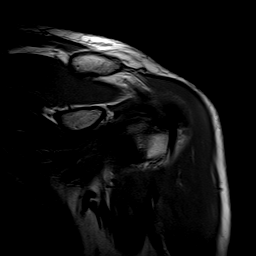
[im 18/18]
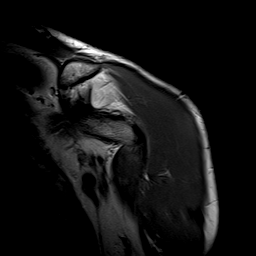

[Series 7: T1 · oblique · 4.0mm · 0.59mm/px · 8 of 20 slices shown]
[im 1/20]
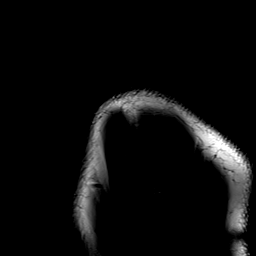
[im 3/20]
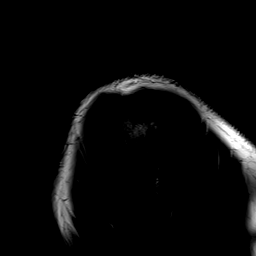
[im 6/20]
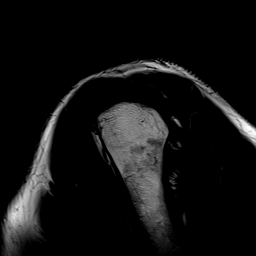
[im 9/20]
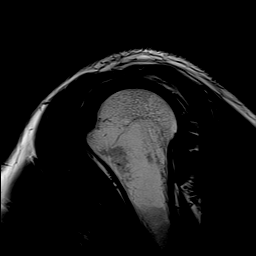
[im 11/20]
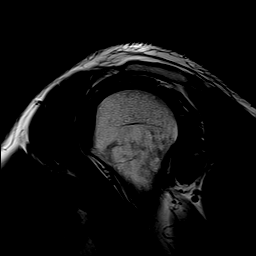
[im 14/20]
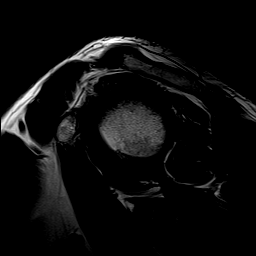
[im 17/20]
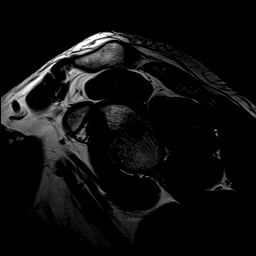
[im 20/20]
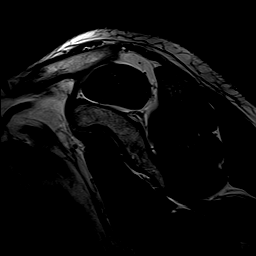

[Series 8: T2 fat-sat · oblique · 4.0mm · 0.59mm/px · 8 of 20 slices shown (3 of 3)]
[im 1/20]
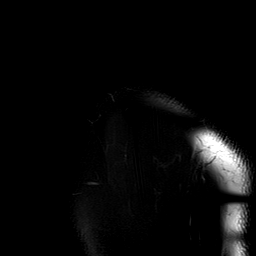
[im 3/20]
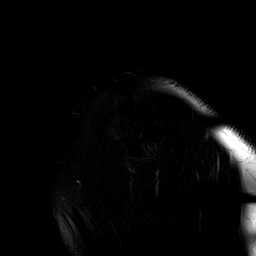
[im 6/20]
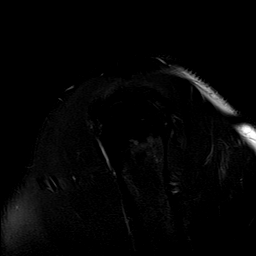
[im 9/20]
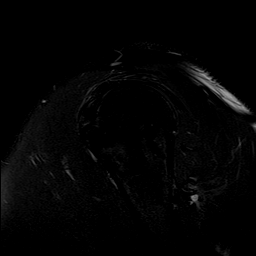
[im 11/20]
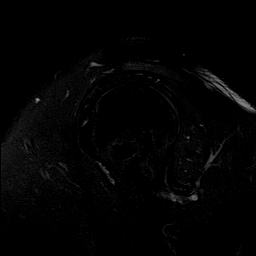
[im 14/20]
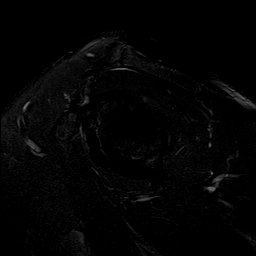
[im 17/20]
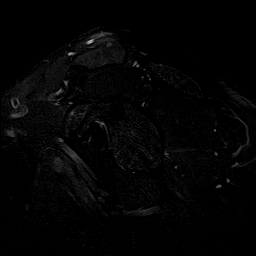
[im 20/20]
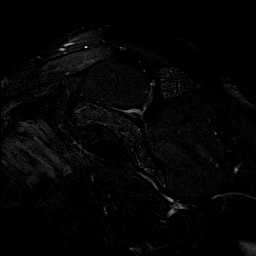

[40 of 40 positions shown; findings below may reference images not displayed]

FINDINGS: Rotator cuff: Moderate tendinosis of the supraspinatus tendon. Mild
tendinosis of the infraspinatus tendon. Teres minor tendon is
intact. Moderate tendinosis of the subscapularis tendon.

Muscles: No atrophy or fatty replacement of nor abnormal signal
within, the muscles of the rotator cuff.

Biceps long head:  Intact.

Acromioclavicular Joint: Normal acromioclavicular joint. Type I
acromion. No subacromial/subdeltoid bursal fluid.

Glenohumeral Joint: No joint effusion. No chondral defect.

Labrum: Grossly intact, but evaluation is limited by lack of
intraarticular fluid.

Bones: No acute osseous abnormality. No aggressive osseous lesion.
Small benign chondroid lesion in the proximal humeral metaphysis.

Other: No fluid collection or hematoma.
IMPRESSION: 1.  Moderate tendinosis of the supraspinatus tendon.
2. Mild tendinosis of the infraspinatus tendon.
3. Moderate tendinosis of the subscapularis tendon.

## 2018-05-30 ENCOUNTER — Ambulatory Visit: Payer: Self-pay | Admitting: Family Medicine

## 2018-06-05 DIAGNOSIS — L600 Ingrowing nail: Secondary | ICD-10-CM | POA: Diagnosis not present

## 2019-05-14 ENCOUNTER — Other Ambulatory Visit: Payer: Self-pay

## 2019-05-14 ENCOUNTER — Ambulatory Visit (INDEPENDENT_AMBULATORY_CARE_PROVIDER_SITE_OTHER): Payer: Commercial Managed Care - PPO | Admitting: Family Medicine

## 2019-05-14 ENCOUNTER — Encounter: Payer: Self-pay | Admitting: Family Medicine

## 2019-05-14 VITALS — BP 130/70 | HR 80 | Ht 68.0 in | Wt 222.0 lb

## 2019-05-14 DIAGNOSIS — G250 Essential tremor: Secondary | ICD-10-CM | POA: Diagnosis not present

## 2019-05-14 DIAGNOSIS — J310 Chronic rhinitis: Secondary | ICD-10-CM

## 2019-05-14 DIAGNOSIS — T485X5A Adverse effect of other anti-common-cold drugs, initial encounter: Secondary | ICD-10-CM

## 2019-05-14 DIAGNOSIS — J0100 Acute maxillary sinusitis, unspecified: Secondary | ICD-10-CM

## 2019-05-14 DIAGNOSIS — D229 Melanocytic nevi, unspecified: Secondary | ICD-10-CM | POA: Diagnosis not present

## 2019-05-14 MED ORDER — AMOXICILLIN 500 MG PO CAPS: 500.0000 mg | ORAL_CAPSULE | Freq: Three times a day (TID) | ORAL | 0 refills | Status: DC

## 2019-05-14 MED ORDER — METOPROLOL SUCCINATE ER 25 MG PO TB24: 25.0000 mg | ORAL_TABLET | Freq: Every day | ORAL | 3 refills | Status: AC

## 2019-05-14 NOTE — Patient Instructions (Addendum)
Oxymetazoline nasal spray What is this medicine? Oxymetazoline (OX ee me TAZ oh leen) is a nasal decongestant. This medicine is used to treat nasal congestion or a stuffy nose. This medicine will not treat an infection. This medicine may be used for other purposes; ask your health care provider or pharmacist if you have questions. COMMON BRAND NAME(S): 12 Hour Nasal, Afrin, Afrin Extra Moisturizing, Afrin Nasal Sinus, Afrin No Drip Severe Congestion, Dristan, Duration, Genasal, Mucinex Children's Stuffy Nose, Mucinex Full Force, Mucinex Moisture Smart, Mucinex Sinus-Max, Mucinex Sinus-Max Sinus & Allergy, NASAL Decongestant, Nasal Relief, Neo-Synephrine 12-Hour, Neo-Synephrine Severe Sinus Congestion, Nostrilla Fast Relief, Sinex 12-Hour, Sudafed OM Sinus Cold Moisturizing, Sudafed OM Sinus Congestion Moisturizing, Vicks Qlearquil Decongestant, Vicks Sinex, Vicks Sinex Severe, Vicks Sinus Daytime, Zicam Extreme Congestion Relief, Zicam Intense Sinus What should I tell my health care provider before I take this medicine?  Essential Tremor A tremor is trembling or shaking that a person cannot control. Most tremors affect the hands or arms. Tremors can also affect the head, vocal cords, legs, and other parts of the body. Essential tremor is a tremor without a known cause. Usually, it occurs while a person is trying to perform an action. It tends to get worse gradually as a person ages. What are the causes? The cause of this condition is not known. What increases the risk? You are more likely to develop this condition if:  You have a family member with essential tremor.  You are age 41 or older.  You take certain medicines. What are the signs or symptoms? The main sign of a tremor is a rhythmic shaking of certain parts of your body that is uncontrolled and unintentional. You may:  Have difficulty eating with a spoon or fork.  Have difficulty writing.  Nod your head up and down or side to  side.  Have a quivering voice. The shaking may:  Get worse over time.  Come and go.  Be more noticeable on one side of your body.  Get worse due to stress, fatigue, caffeine, and extreme heat or cold. How is this diagnosed? This condition may be diagnosed based on:  Your symptoms and medical history.  A physical exam. There is no single test to diagnose an essential tremor. However, your health care provider may order tests to rule out other causes of your condition. These may include:  Blood and urine tests.  Imaging studies of your brain, such as CT scan and MRI.  A test that measures involuntary muscle movement (electromyogram). How is this treated? Treatment for essential tremor depends on the severity of the condition.  Some tremors may go away without treatment.  Mild tremors may not need treatment if they do not affect your day-to-day life.  Severe tremors may need to be treated using one or more of the following options: ? Medicines. ? Lifestyle changes. ? Occupational or physical therapy. Follow these instructions at home: Lifestyle   Do not use any products that contain nicotine or tobacco, such as cigarettes and e-cigarettes. If you need help quitting, ask your health care provider.  Limit your caffeine intake as told by your health care provider.  Try to get 8 hours of sleep each night.  Find ways to manage your stress that fits your lifestyle and personality. Consider trying meditation or yoga.  Try to anticipate stressful situations and allow extra time to manage them.  If you are struggling emotionally with the effects of your tremor, consider working with a mental  health provider. General instructions  Take over-the-counter and prescription medicines only as told by your health care provider.  Avoid extreme heat and extreme cold.  Keep all follow-up visits as told by your health care provider. This is important. Visits may include physical  therapy visits. Contact a health care provider if:  You experience any changes in the location or intensity of your tremors.  You start having a tremor after starting a new medicine.  You have tremor with other symptoms, such as: ? Numbness. ? Tingling. ? Pain. ? Weakness.  Your tremor gets worse.  Your tremor interferes with your daily life.  You feel down, blue, or sad for at least 2 weeks in a row.  Worrying about your tremor and what other people think about you interferes with your everyday life functions, including relationships, work, or school. Summary  Essential tremor is a tremor without a known cause. Usually, it occurs when you are trying to perform an action.  The cause of this condition is not known.  The main sign of a tremor is a rhythmic shaking of certain parts of your body that is uncontrolled and unintentional.  Treatment for essential tremor depends on the severity of the condition. This information is not intended to replace advice given to you by your health care provider. Make sure you discuss any questions you have with your health care provider. Document Revised: 04/19/2017 Document Reviewed: 04/19/2017 Elsevier Patient Education  [employer]. They need to know if you have any of these conditions:  diabetes  glaucoma  heart disease  high or low blood pressure  history of stroke  Raynaud's phenomenon  scleroderma  Sjogren's syndrome  thromboangiitis obliterans  thyroid disease  trouble urinating due to an enlarged prostate gland  an unusual or allergic reaction to oxymetazoline, other medicines, foods, dyes, or preservatives  pregnant or trying to get pregnant  breast-feeding How should I use this medicine? This medicine is for use in the nose. Do not take by mouth. Follow the directions on the package label. Shake well before using. Use your medicine at regular intervals or as directed by your health care provider. Do not  use it more often than directed. Do not use for more than 3 days in a row without advice. Make sure that you are using your nasal spray correctly. Ask your doctor or health care provider if you have any questions. Talk to your pediatrician regarding the use of this medicine in children. While this drug may be prescribed for children as young as 6 years for selected conditions, precautions do apply. Overdosage: If you think you have taken too much of this medicine contact a poison control center or emergency room at once. NOTE: This medicine is only for you. Do not share this medicine with others. What if I miss a dose? If you miss a dose, use it as soon as you can. If it is almost time for your next dose, use only that dose. Do not use double or extra doses. What may interact with this medicine? The medicine may interaction with the following medications:  MAOIs like isocarboxazid, phenelzine, rasagiline, selegiline, and tranylcypromine  medicines to treat blood pressure and heart disease like ace-inhibitors, beta-blockers, calcium-channel blockers, digoxin, and diuretics  medicines to treat enlarged prostate like alfuzosin, doxazosin, prazosin, and terazosin  nafarelin This list may not describe all possible interactions. Give your health care provider a list of all the medicines, herbs, non-prescription drugs, or dietary supplements you use.  Also tell them if you smoke, drink alcohol, or use illegal drugs. Some items may interact with your medicine. What should I watch for while using this medicine? Tell your doctor or health care professional if your symptoms do not start to get better or if they get worse. To prevent the spread of infection, do not share bottle with anyone else. What side effects may I notice from receiving this medicine? Side effects that you should report to your doctor or health care professional as soon as possible:  allergic reactions like skin rash, itching or hives,  swelling of the face, lips, or tongue Side effects that usually do not require medical attention (report to your doctor or health care professional if they continue or are bothersome):  burning, stinging, or irritation in the nose right after use  increased nasal discharge  sneezing This list may not describe all possible side effects. Call your doctor for medical advice about side effects. You may report side effects to FDA at 1-800-FDA-1088. Where should I keep my medicine? Keep out of the reach of children. Store at room temperature between 20 and 25 degrees C (68 and 77 degrees F). Throw away any unused medicine after the expiration date. NOTE: This sheet is a summary. It may not cover all possible information. If you have questions about this medicine, talk to your doctor, pharmacist, or health care provider.  2020 Elsevier/Gold Standard (2015-06-02 13:48:04)

## 2019-05-14 NOTE — Progress Notes (Signed)
Date:  05/14/2019   Name:  Cole Austin   DOB:  1975/09/16   MRN:  [id number]   Chief Complaint: Nevus (mole on penis- gotten bigger over the years), nose stuffiness (has not been good since going to [place]), and Tremors  Rash This is a chronic (for eval of mole) problem. The current episode started more than 1 year ago. The problem has been gradually worsening (expanding) since onset. The affected locations include the genitalia. Rash characteristics: mole. He was exposed to nothing. Associated symptoms include congestion and rhinorrhea. Pertinent negatives include no cough, diarrhea, fever, shortness of breath or sore throat.  URI  Associated symptoms include congestion, a rash and rhinorrhea. Pertinent negatives include no abdominal pain, chest pain, coughing, diarrhea, dysuria, ear pain, headaches, nausea, neck pain, sore throat or wheezing.  Neurologic Problem The patient's pertinent negatives include no altered mental status, clumsiness, focal sensory loss, focal weakness, loss of balance, memory loss, near-syncope, slurred speech, syncope, visual change or weakness. This is a recurrent problem. Pertinent negatives include no abdominal pain, back pain, chest pain, dizziness, fever, headaches, nausea, neck pain, palpitations or shortness of breath.    No results found for: CREATININE, BUN, NA, K, CL, CO2 No results found for: CHOL, HDL, LDLCALC, LDLDIRECT, TRIG, CHOLHDL No results found for: TSH No results found for: HGBA1C   Review of Systems  Constitutional: Negative for chills and fever.  HENT: Positive for congestion and rhinorrhea. Negative for drooling, ear discharge, ear pain and sore throat.   Respiratory: Negative for cough, shortness of breath and wheezing.   Cardiovascular: Negative for chest pain, palpitations, leg swelling and near-syncope.  Gastrointestinal: Negative for abdominal pain, blood in stool, constipation, diarrhea and nausea.  Endocrine: Negative  for polydipsia.  Genitourinary: Negative for dysuria, frequency, hematuria and urgency.  Musculoskeletal: Negative for back pain, myalgias and neck pain.  Skin: Positive for rash.  Allergic/Immunologic: Negative for environmental allergies.  Neurological: Negative for dizziness, focal weakness, syncope, weakness, headaches and loss of balance.  Hematological: Does not bruise/bleed easily.  Psychiatric/Behavioral: Negative for memory loss and suicidal ideas. The patient is not nervous/anxious.     Patient Active Problem List   Diagnosis Date Noted  . Encounter for general adult medical examination without abnormal findings 10/01/2014  . Low back strain 08/13/2014  . Hx of fracture of rib 08/13/2014    No Known Allergies  Past Surgical History:  Procedure Laterality Date  . LEG SURGERY     relieve pressure in leg  . mouth wired    . SHOULDER SURGERY Right    torn ligament/ exploratory  . VASECTOMY N/A 08/09/2015   Procedure: VASECTOMY;  Surgeon: Hollice Espy, MD;  Location: [facility] ORS;  Service: Urology;  Laterality: N/A;  . VASECTOMY      Social History   Tobacco Use  . Smoking status: Former Smoker    Packs/day: 1.00  . Smokeless tobacco: Former Systems developer    Types: Chew  Substance Use Topics  . Alcohol use: No    Alcohol/week: 0.0 standard drinks  . Drug use: No     Medication list has been reviewed and updated.  Current Meds  Medication Sig  . oxymetazoline (AFRIN) 0.05 % nasal spray Place 1 spray into both nostrils 2 (two) times daily.    PHQ 2/9 Scores 05/14/2019 02/06/2017  PHQ - 2 Score 0 0  PHQ- 9 Score 2 0    BP Readings from Last 3 Encounters:  05/14/19 130/70  11/26/17 Marland Kitchen)  141/96  02/06/17 120/70    Physical Exam Vitals and nursing note reviewed.  HENT:     Head: Normocephalic.     Jaw: There is normal jaw occlusion.     Right Ear: Tympanic membrane and external ear normal.     Left Ear: Tympanic membrane and external ear normal.     Nose: Nose  normal.     Right Turbinates: Enlarged and swollen.     Left Turbinates: Enlarged and swollen.     Comments: erythematous Eyes:     General: No scleral icterus.       Right eye: No discharge.        Left eye: No discharge.     Conjunctiva/sclera: Conjunctivae normal.     Pupils: Pupils are equal, round, and reactive to light.  Neck:     Thyroid: No thyromegaly.     Vascular: No JVD.     Trachea: No tracheal deviation.  Cardiovascular:     Rate and Rhythm: Normal rate and regular rhythm.     Heart sounds: Normal heart sounds. No murmur. No friction rub. No gallop.   Pulmonary:     Effort: No respiratory distress.     Breath sounds: Normal breath sounds. No wheezing or rales.  Abdominal:     General: Bowel sounds are normal.     Palpations: Abdomen is soft. There is no mass.     Tenderness: There is no abdominal tenderness. There is no guarding or rebound.  Genitourinary:    Penis: Lesions present.      Comments: Pigmented nevus/raised Musculoskeletal:        General: No tenderness. Normal range of motion.     Cervical back: Normal range of motion and neck supple.  Lymphadenopathy:     Cervical: No cervical adenopathy.  Skin:    General: Skin is warm.     Findings: No rash.  Neurological:     Mental Status: He is alert and oriented to person, place, and time.     Cranial Nerves: No cranial nerve deficit.     Motor: Tremor present.     Deep Tendon Reflexes: Reflexes are normal and symmetric.     Wt Readings from Last 3 Encounters:  05/14/19 222 lb (100.7 kg)  11/26/17 192 lb (87.1 kg)  02/06/17 182 lb (82.6 kg)    BP 130/70   Pulse 80   Ht 5\' 8"  (1.727 m)   Wt 222 lb (100.7 kg)   BMI 33.75 kg/m   Assessment and Plan:  1. Rhinitis medicamentosa New onset.  Persistent.  Uncontrolled.  Shortly after returning from a trip to [place] in which the patient initiated treatment with a Vick's nasal spray that the patient noted that he was unable to breathe at night and  that he was having to use the the spray on a nightly basis.  This is contributed to continued congestion in the nose on examination it was noted that the turbinates are enlarged and inflamed.  I believe patient has rhinitis medicamentosa and the treatment is discontinuance of the nasal decongestant.  Patient understands and we will proceed in this direction.  2. Acute maxillary sinusitis, recurrence not specified Patient does have concern about the possibility of infection and there has been a closure of drainage to the maxillary sinus and while patient is undergoing be treatment of the rebound congestion we will cover with amoxicillin 500 mg 3 times a day for the possible infection. - amoxicillin (AMOXIL) 500 MG capsule; Take  1 capsule (500 mg total) by mouth 3 (three) times daily.  Dispense: 30 capsule; Refill: 0  3. Benign essential tremor New onset.  Patient has had this for about a year.  But it seems to be getting worse this is the initial presentation.  Patient has a family history with the mother having what sounds like late stages of essential tremor.  The tremor is fine and is consistent with the benign essential tremor and we will initiate Toprol 25 mg SR once a day to see if this may help with some of the symptoms.  In the meantime our next step would be consideration of a neurology consult.  4. Nevus Patient has a nevus to send the falls of the penis to the glans.  This is a pigmented area which is about 2 cm x 1 cm in size.  There is an adjacent smaller area that is of a similar nature this looks more like a pigmented nevus rather than a wart or seborrheic keratoses.  Will refer to dermatology for evaluation and treatment. - Ambulatory referral to Dermatology

## 2019-06-24 DIAGNOSIS — A630 Anogenital (venereal) warts: Secondary | ICD-10-CM | POA: Diagnosis not present

## 2019-07-16 DIAGNOSIS — A630 Anogenital (venereal) warts: Secondary | ICD-10-CM | POA: Diagnosis not present

## 2019-08-06 DIAGNOSIS — A630 Anogenital (venereal) warts: Secondary | ICD-10-CM | POA: Diagnosis not present

## 2020-08-30 ENCOUNTER — Other Ambulatory Visit: Payer: Self-pay | Admitting: Family Medicine

## 2020-11-20 ENCOUNTER — Other Ambulatory Visit: Payer: Self-pay

## 2020-11-20 ENCOUNTER — Ambulatory Visit
Admission: EM | Admit: 2020-11-20 | Discharge: 2020-11-20 | Disposition: A | Discharge status: Home / Self Care | Payer: Commercial Managed Care - PPO

## 2020-11-20 DIAGNOSIS — R1031 Right lower quadrant pain: Secondary | ICD-10-CM

## 2020-11-20 DIAGNOSIS — R110 Nausea: Secondary | ICD-10-CM | POA: Diagnosis not present

## 2020-11-20 DIAGNOSIS — M549 Dorsalgia, unspecified: Secondary | ICD-10-CM | POA: Diagnosis not present

## 2020-11-20 DIAGNOSIS — Z20822 Contact with and (suspected) exposure to covid-19: Secondary | ICD-10-CM | POA: Diagnosis not present

## 2020-11-20 DIAGNOSIS — Z87891 Personal history of nicotine dependence: Secondary | ICD-10-CM | POA: Diagnosis not present

## 2020-11-20 DIAGNOSIS — G8929 Other chronic pain: Secondary | ICD-10-CM | POA: Diagnosis not present

## 2020-11-20 DIAGNOSIS — R1032 Left lower quadrant pain: Secondary | ICD-10-CM | POA: Diagnosis not present

## 2020-11-20 DIAGNOSIS — K5900 Constipation, unspecified: Secondary | ICD-10-CM | POA: Diagnosis not present

## 2020-11-20 NOTE — Discharge Instructions (Addendum)
Go to the emergency department for evaluation of your acute right lower quadrant abdominal pain.

## 2020-11-20 NOTE — ED Triage Notes (Signed)
Patient states that he woke up around 2 am with right upper quadrant pain. States that he has felt sort of nausea.

## 2020-11-20 NOTE — ED Provider Notes (Addendum)
[facility]    CSN: [id number] Arrival date & time: 11/20/20  [id number]      History   Chief Complaint Chief Complaint  Patient presents with   Abdominal Pain    RLQ     HPI Cole Austin is a 45 y.o. male.  Patient presents with acute right lower quadrant abdominal pain since 2 AM.  Current pain level 9/10.  He states he was sleeping and the pain woke him up.  He reports nausea; no vomiting.  He denies fever, chills, or other symptoms.  The history is provided by the patient and medical records.   Past Medical History:  Diagnosis Date   Chronic back pain    Complication of anesthesia    WOKE UP DURING ALL SURGERIES   Pain    CHRONIC BACK    Patient Active Problem List   Diagnosis Date Noted   Encounter for general adult medical examination without abnormal findings 10/01/2014   Low back strain 08/13/2014   Hx of fracture of rib 08/13/2014    Past Surgical History:  Procedure Laterality Date   LEG SURGERY     relieve pressure in leg   mouth wired     SHOULDER SURGERY Right    torn ligament/ exploratory   VASECTOMY N/A 08/09/2015   Procedure: VASECTOMY;  Surgeon: Hollice Espy, MD;  Location: [facility] ORS;  Service: Urology;  Laterality: N/A;   VASECTOMY         Home Medications    Prior to Admission medications   Medication Sig Start Date End Date Taking? Authorizing Provider  amoxicillin (AMOXIL) 500 MG capsule Take 1 capsule (500 mg total) by mouth 3 (three) times daily. 05/14/19   Juline Patch, MD  metoprolol succinate (TOPROL-XL) 25 MG 24 hr tablet Take 1 tablet (25 mg total) by mouth daily. 05/14/19   Juline Patch, MD  oxymetazoline (AFRIN) 0.05 % nasal spray Place 1 spray into both nostrils 2 (two) times daily.    [provider]    Family History Family History  Problem Relation Age of Onset   Cancer Maternal Uncle    Heart disease Maternal Grandfather    Hypertension Mother    Hypertension Father    Kidney cancer Neg  Hx    Prostate cancer Neg Hx     Social History Social History   Tobacco Use   Smoking status: Former    Packs/day: 1.00    Types: Cigarettes   Smokeless tobacco: Former    Types: Nurse, children's Use: Never used  Substance Use Topics   Alcohol use: No    Alcohol/week: 0.0 standard drinks   Drug use: No     Allergies   Patient has no known allergies.   Review of Systems Review of Systems  Constitutional:  Negative for chills and fever.  Respiratory:  Negative for cough and shortness of breath.   Cardiovascular:  Negative for chest pain and palpitations.  Gastrointestinal:  Positive for abdominal pain and nausea. Negative for diarrhea and vomiting.  Genitourinary:  Negative for dysuria and hematuria.  All other systems reviewed and are negative.   Physical Exam Triage Vital Signs ED Triage Vitals  Enc Vitals Group     BP      Pulse      Resp      Temp      Temp src      SpO2      Weight  Height      Head Circumference      Peak Flow      Pain Score      Pain Loc      Pain Edu?      Excl. in [place]?    No data found.  Updated Vital Signs BP 130/74 (BP Location: Left Arm)   Pulse 73   Temp 98.4 F (36.9 C)   Resp 18   Ht '5\' 8"'$  (1.727 m)   Wt 197 lb (89.4 kg)   SpO2 98%   BMI 29.95 kg/m   Visual Acuity Right Eye Distance:   Left Eye Distance:   Bilateral Distance:    Right Eye Near:   Left Eye Near:    Bilateral Near:     Physical Exam Vitals and nursing note reviewed.  Constitutional:      General: He is in acute distress.     Appearance: He is well-developed.  HENT:     Head: Normocephalic and atraumatic.     Mouth/Throat:     Mouth: Mucous membranes are moist.  Eyes:     Conjunctiva/sclera: Conjunctivae normal.  Cardiovascular:     Rate and Rhythm: Normal rate and regular rhythm.     Heart sounds: Normal heart sounds.  Pulmonary:     Effort: Pulmonary effort is normal. No respiratory distress.     Breath sounds:  Normal breath sounds.  Abdominal:     Palpations: Abdomen is soft.     Tenderness: There is abdominal tenderness in the right lower quadrant. There is no guarding or rebound.  Musculoskeletal:     Cervical back: Neck supple.  Skin:    General: Skin is warm and dry.  Neurological:     General: No focal deficit present.     Mental Status: He is alert and oriented to person, place, and time.  Psychiatric:        Mood and Affect: Mood normal.        Behavior: Behavior normal.     UC Treatments / Results  Labs (all labs ordered are listed, but only abnormal results are displayed) Labs Reviewed - No data to display  EKG   Radiology No results found.  Procedures Procedures (including critical care time)  Medications Ordered in UC Medications - No data to display  Initial Impression / Assessment and Plan / UC Course  I have reviewed the triage vital signs and the nursing notes.  Pertinent labs & imaging results that were available during my care of the patient were reviewed by me and considered in my medical decision making (see chart for details).  Acute right lower quadrant abdominal pain.  Patient's current pain level is 9/10.  He is grimacing and holding his right lower quadrant.  Sending him to the ED for evaluation as I am concerned for possible appendicitis.  He declines EMS and feels stable for transport POV.   Final Clinical Impressions(s) / UC Diagnoses   Final diagnoses:  Right lower quadrant abdominal pain     Discharge Instructions      Go to the emergency department for evaluation of your acute right lower quadrant abdominal pain.     ED Prescriptions   None    I have reviewed the PDMP during this encounter.   Sharion Balloon, NP 11/20/20 0857    Sharion Balloon, NP 11/20/20 [phone]

## 2020-11-20 NOTE — ED Notes (Signed)
Patient is being discharged from the Urgent Care and sent to the Emergency Department via POV . Per Barkley Boards, NP, patient is in need of higher level of care due to possible appendicitis. Patient is aware and verbalizes understanding of plan of care.  Vitals:   11/20/20 0841  BP: 130/74  Pulse: 73  Resp: 18  Temp: 98.4 F (36.9 C)  SpO2: 98%

## 2020-11-21 ENCOUNTER — Telehealth: Payer: Self-pay

## 2020-11-21 NOTE — Telephone Encounter (Signed)
Transition Care Management Follow-up Telephone Call Date of discharge and from where: 11/20/2020-[facility] How have you been since you were released from the hospital? Patient stated he is doing fine.  Any questions or concerns? No  Items Reviewed: Did the pt receive and understand the discharge instructions provided? Yes  Medications obtained and verified? Yes  Other? No  Any new allergies since your discharge? No  Dietary orders reviewed? N/A Do you have support at home? Yes   Home Care and Equipment/Supplies: Were home health services ordered? not applicable If so, what is the name of the agency? N/A  Has the agency set up a time to come to the patient's home? not applicable Were any new equipment or medical supplies ordered?  No What is the name of the medical supply agency? N/A Were you able to get the supplies/equipment? not applicable Do you have any questions related to the use of the equipment or supplies? No  Functional Questionnaire: (I = Independent and D = Dependent) ADLs: I  Bathing/Dressing- I  Meal Prep- I  Eating- I  Maintaining continence- I  Transferring/Ambulation- I  Managing Meds- I  Follow up appointments reviewed:  PCP Hospital f/u appt confirmed? No   Specialist Hospital f/u appt confirmed? No   Are transportation arrangements needed? No  If their condition worsens, is the pt aware to call PCP or go to the Emergency Dept.? Yes Was the patient provided with contact information for the PCP's office or ED? Yes Was to pt encouraged to call back with questions or concerns? Yes

## 2020-11-22 ENCOUNTER — Other Ambulatory Visit: Payer: Self-pay

## 2020-11-22 ENCOUNTER — Ambulatory Visit (INDEPENDENT_AMBULATORY_CARE_PROVIDER_SITE_OTHER): Payer: Commercial Managed Care - PPO | Admitting: Family Medicine

## 2020-11-22 DIAGNOSIS — Z79891 Long term (current) use of opiate analgesic: Secondary | ICD-10-CM | POA: Diagnosis not present

## 2020-11-22 DIAGNOSIS — N5089 Other specified disorders of the male genital organs: Secondary | ICD-10-CM | POA: Diagnosis not present

## 2020-11-22 DIAGNOSIS — Z87891 Personal history of nicotine dependence: Secondary | ICD-10-CM | POA: Diagnosis not present

## 2020-11-22 DIAGNOSIS — K439 Ventral hernia without obstruction or gangrene: Secondary | ICD-10-CM | POA: Diagnosis not present

## 2020-11-22 DIAGNOSIS — G8929 Other chronic pain: Secondary | ICD-10-CM | POA: Diagnosis not present

## 2020-11-22 DIAGNOSIS — M549 Dorsalgia, unspecified: Secondary | ICD-10-CM | POA: Diagnosis not present

## 2020-11-22 DIAGNOSIS — R109 Unspecified abdominal pain: Secondary | ICD-10-CM | POA: Diagnosis not present

## 2020-11-22 NOTE — Progress Notes (Signed)
Pt came in. He was doubled over and could barely walk when I brought him back to the room. He said he was at [facility] yesterday and was told if no better to come here. After reviewing the [facility] notes, patient has been sent back to [facility] for further or repeat testing. He states the Oxycodone isn't "touching the pain."

## 2020-11-23 ENCOUNTER — Telehealth: Payer: Self-pay

## 2020-11-23 NOTE — Telephone Encounter (Signed)
Transition Care Management Unsuccessful Follow-up Telephone Call  Date of discharge and from where:  11/22/2020-[facility]  Attempts:  1st Attempt  Reason for unsuccessful TCM follow-up call:  Left voice message

## 2020-11-24 NOTE — Telephone Encounter (Signed)
Transition Care Management Follow-up Telephone Call Date of discharge and from where: 11/22/2020-[facility]  How have you been since you were released from the hospital? Patient stated he is doing better.  Any questions or concerns? No  Items Reviewed: Did the pt receive and understand the discharge instructions provided? Yes  Medications obtained and verified? Yes  Other? No  Any new allergies since your discharge? No  Dietary orders reviewed? N/A Do you have support at home? Yes   Home Care and Equipment/Supplies: Were home health services ordered? not applicable If so, what is the name of the agency? N/A  Has the agency set up a time to come to the patient's home? not applicable Were any new equipment or medical supplies ordered?  No What is the name of the medical supply agency? N/A Were you able to get the supplies/equipment? not applicable Do you have any questions related to the use of the equipment or supplies? No  Functional Questionnaire: (I = Independent and D = Dependent) ADLs: I  Bathing/Dressing- I  Meal Prep- I  Eating- I  Maintaining continence- I  Transferring/Ambulation- I  Managing Meds- I  Follow up appointments reviewed:  PCP Hospital f/u appt confirmed? No   Specialist Hospital f/u appt confirmed? No   Are transportation arrangements needed? No  If their condition worsens, is the pt aware to call PCP or go to the Emergency Dept.? Yes Was the patient provided with contact information for the PCP's office or ED? Yes Was to pt encouraged to call back with questions or concerns? Yes

## 2020-11-25 ENCOUNTER — Telehealth: Payer: Self-pay

## 2020-11-25 NOTE — Telephone Encounter (Signed)
Copied from [place] [id number]. Topic: General - Inquiry >> Nov 25, 2020  8:59 AM Oneta Rack wrote: Reason for CRM:  patient was seen in the ED and was advised to call PCP and ask for a PSA  test and a referral to urology. Patient declined to schedule a follow up with PCP and would like a follow up call today.

## 2020-11-25 NOTE — Telephone Encounter (Signed)
Called pt and gave him Caryl Pina at [facility] urology's number to call and set up follow up appt. Dr Jerline Pain from [facility] ER wanted pt to see them in follow up. Caryl Pina stated she has texted pt yesterday and called him this morning without any answer. I gave pt her name and the number [phone] to call them and get set up. Pt stated he would call

## 2021-01-18 DIAGNOSIS — N452 Orchitis: Secondary | ICD-10-CM | POA: Diagnosis not present

## 2021-01-20 DIAGNOSIS — N452 Orchitis: Secondary | ICD-10-CM | POA: Diagnosis not present

## 2021-02-21 ENCOUNTER — Ambulatory Visit (INDEPENDENT_AMBULATORY_CARE_PROVIDER_SITE_OTHER): Payer: Commercial Managed Care - PPO | Admitting: Family Medicine

## 2021-02-21 ENCOUNTER — Encounter: Payer: Self-pay | Admitting: Family Medicine

## 2021-02-21 ENCOUNTER — Other Ambulatory Visit: Payer: Self-pay

## 2021-02-21 VITALS — BP 110/70 | HR 84 | Ht 68.0 in | Wt 198.0 lb

## 2021-02-21 DIAGNOSIS — Z23 Encounter for immunization: Secondary | ICD-10-CM

## 2021-02-21 DIAGNOSIS — G250 Essential tremor: Secondary | ICD-10-CM | POA: Diagnosis not present

## 2021-02-21 DIAGNOSIS — Z008 Encounter for other general examination: Secondary | ICD-10-CM

## 2021-02-21 DIAGNOSIS — Z0000 Encounter for general adult medical examination without abnormal findings: Secondary | ICD-10-CM | POA: Diagnosis not present

## 2021-02-21 DIAGNOSIS — B078 Other viral warts: Secondary | ICD-10-CM

## 2021-02-21 LAB — HEMOCCULT GUIAC POC 1CARD (OFFICE): Fecal Occult Blood, POC: NEGATIVE

## 2021-02-21 NOTE — Progress Notes (Signed)
Date:  02/21/2021   Name:  Cole Austin   DOB:  27-Feb-1976   MRN:  [id number]   Chief Complaint: Annual Exam (Biometric screening) and Flu Vaccine  Cole Austin is a 45 y.o. male who presents today for his Complete Annual Exam. He feels well. He reports exercising working. He reports he is sleeping well.     No results found for: CREATININE, BUN, NA, K, CL, CO2 No results found for: CHOL, HDL, LDLCALC, LDLDIRECT, TRIG, CHOLHDL No results found for: TSH No results found for: HGBA1C No results found for: WBC, HGB, HCT, MCV, PLT No results found for: ALT, AST, GGT, ALKPHOS, BILITOT   Review of Systems  Constitutional: Negative.   HENT: Negative.    Eyes: Negative.   Respiratory: Negative.    Cardiovascular: Negative.   Gastrointestinal: Negative.   Endocrine: Negative.   Genitourinary: Negative.   Musculoskeletal: Negative.   Skin: Negative.        warts  Allergic/Immunologic: Negative.   Neurological:  Positive for tremors. Negative for syncope, light-headedness and numbness.  Hematological: Negative.   Psychiatric/Behavioral: Negative.     Patient Active Problem List   Diagnosis Date Noted   Encounter for general adult medical examination without abnormal findings 10/01/2014   Low back strain 08/13/2014   Hx of fracture of rib 08/13/2014    No Known Allergies  Past Surgical History:  Procedure Laterality Date   LEG SURGERY     relieve pressure in leg   mouth wired     SHOULDER SURGERY Right    torn ligament/ exploratory   VASECTOMY N/A 08/09/2015   Procedure: VASECTOMY;  Surgeon: Hollice Espy, MD;  Location: [facility];  Service: Urology;  Laterality: N/A;   VASECTOMY      Social History   Tobacco Use   Smoking status: Former    Packs/day: 1.00    Types: Cigarettes   Smokeless tobacco: Former    Types: Nurse, children's Use: Never used  Substance Use Topics   Alcohol use: No    Alcohol/week: 0.0 standard drinks   Drug use:  No     Medication list has been reviewed and updated.  No outpatient medications have been marked as taking for the 02/21/21 encounter (Office Visit) with Juline Patch, MD.    Cole Austin 2/9 Scores 02/21/2021 05/14/2019 02/06/2017  PHQ - 2 Score 0 0 0  PHQ- 9 Score 0 2 0    GAD 7 : Generalized Anxiety Score 02/21/2021 05/14/2019  Nervous, Anxious, on Edge 0 0  Control/stop worrying 0 0  Worry too much - different things 0 0  Trouble relaxing 0 0  Restless 0 0  Easily annoyed or irritable 0 0  Afraid - awful might happen 0 0  Total GAD 7 Score 0 0    BP Readings from Last 3 Encounters:  02/21/21 110/70  11/20/20 130/74  05/14/19 130/70    Physical Exam Vitals and nursing note reviewed.  Constitutional:      Appearance: He is well-groomed and normal weight.  HENT:     Head: Normocephalic.     Jaw: There is normal jaw occlusion.     Right Ear: Hearing, tympanic membrane, ear canal and external ear normal.     Left Ear: Hearing, tympanic membrane, ear canal and external ear normal.     Nose: Nose normal.     Mouth/Throat:     Lips: Pink.     Mouth: Mucous  membranes are moist. No oral lesions.     Dentition: No gum lesions.     Tongue: No lesions.     Palate: No lesions.     Pharynx: Oropharynx is clear. Uvula midline. No posterior oropharyngeal erythema.  Eyes:     General: Lids are normal. Vision grossly intact. Gaze aligned appropriately. No scleral icterus.       Right eye: No discharge.        Left eye: No discharge.     Extraocular Movements: Extraocular movements intact.     Conjunctiva/sclera: Conjunctivae normal.     Pupils: Pupils are equal, round, and reactive to light.  Neck:     Thyroid: No thyroid mass, thyromegaly or thyroid tenderness.     Vascular: Normal carotid pulses. No carotid bruit, hepatojugular reflux or JVD.     Trachea: Trachea and phonation normal. No tracheal deviation.  Cardiovascular:     Rate and Rhythm: Normal rate and regular rhythm.      Pulses:          Carotid pulses are 2+ on the right side and 2+ on the left side.      Radial pulses are 2+ on the right side and 2+ on the left side.       Femoral pulses are 2+ on the right side and 2+ on the left side.      Popliteal pulses are 2+ on the right side and 2+ on the left side.       Dorsalis pedis pulses are 2+ on the right side and 2+ on the left side.       Posterior tibial pulses are 2+ on the right side and 2+ on the left side.     Heart sounds: Normal heart sounds, S1 normal and S2 normal. No murmur heard. No systolic murmur is present.  No diastolic murmur is present.    No friction rub. No gallop. No S3 or S4 sounds.  Pulmonary:     Effort: Pulmonary effort is normal. No respiratory distress.     Breath sounds: Normal breath sounds. No decreased breath sounds, wheezing, rhonchi or rales.  Chest:  Breasts:    Right: Normal. No mass.     Left: Normal. No mass.  Abdominal:     General: Abdomen is flat. Bowel sounds are normal.     Palpations: Abdomen is soft. There is no hepatomegaly, splenomegaly or mass.     Tenderness: There is no abdominal tenderness. There is no guarding or rebound.     Hernia: No hernia is present. There is no hernia in the umbilical area, ventral area, left inguinal area or right inguinal area.  Genitourinary:    Penis: Lesions present.      Testes: Normal.        Right: Mass not present.        Left: Mass not present.     Epididymis:     Right: Normal.     Left: Normal.     Comments: C/w partially treated warts left shaft Musculoskeletal:        General: No tenderness. Normal range of motion.     Cervical back: Full passive range of motion without pain, normal range of motion and neck supple.     Right lower leg: No edema.     Left lower leg: No edema.  Lymphadenopathy:     Head:     Right side of head: No submental or submandibular adenopathy.     Left  side of head: No submental or submandibular adenopathy.     Cervical: No  cervical adenopathy.     Right cervical: No superficial, deep or posterior cervical adenopathy.    Left cervical: No superficial, deep or posterior cervical adenopathy.     Upper Body:     Right upper body: No supraclavicular or axillary adenopathy.     Left upper body: No supraclavicular or axillary adenopathy.  Skin:    General: Skin is warm.     Findings: Lesion present. No rash. Rash is not crusting or macular.  Neurological:     Mental Status: He is alert and oriented to person, place, and time.     Cranial Nerves: Cranial nerves 2-12 are intact. No cranial nerve deficit.     Sensory: Sensation is intact.     Motor: Motor function is intact.     Deep Tendon Reflexes: Reflexes are normal and symmetric.     Reflex Scores:      Tricep reflexes are 2+ on the right side and 2+ on the left side.      Bicep reflexes are 2+ on the right side and 2+ on the left side.      Brachioradialis reflexes are 2+ on the right side and 2+ on the left side.      Patellar reflexes are 2+ on the right side and 2+ on the left side.      Achilles reflexes are 2+ on the right side and 2+ on the left side. Psychiatric:        Behavior: Behavior is cooperative.    Wt Readings from Last 3 Encounters:  02/21/21 198 lb (89.8 kg)  11/20/20 197 lb (89.4 kg)  05/14/19 222 lb (100.7 kg)    BP 110/70   Pulse 84   Ht 5\' 8"  (1.727 m)   Wt 198 lb (89.8 kg)   BMI 30.11 kg/m   Assessment and Plan:  Patient is a 45 year old male who presents for a comprehensive physical exam. The patient reports the following problems: tremor. Health maintenance has been reviewed utd  Cole Austin is a 45 y.o. male who presents today for his Complete Annual Exam. He feels well. He reports exercising with activity at work. He reports he is sleeping well.  Patient's chart was reviewed for previous encounters most recent labs most recent imaging in [place]. 1. Annual physical exam Immunizations are reviewed and  recommendations provided.   Age appropriate screening tests are discussed. Counseling given for risk factor reduction interventions.  No subjective/objective concerns noted during history, review of systems, and physical exam.  We will obtain labs that are requested by biometric screening. - Renal Function Panel - Lipid Panel With LDL/HDL Ratio - POCT Occult Blood Stool  2. Encounter for biometric screening Probable need for A1c given that this is often requested by biometric screening and we do not have instruction. - HgB A1c  3. Benign essential tremor Chronic.  Persistent.  Stable.  Patient has a fine resting tremor which is unresolving.  Would like to have a neurology evaluation and we will place referral. - Ambulatory referral to Neurology  4. Other viral warts Chronic.  Persistent.  Stable.  These have been treated by dermatology and now decreased in size but not eradicated.  Patient is to return to dermatology for further treatment.  5. Need for immunization against influenza Discussed and administered. - HgB A1c - Flu Vaccine QUAD 52mo+IM (Fluarix, Fluzone & Alfiuria Quad PF)

## 2021-02-22 LAB — LIPID PANEL WITH LDL/HDL RATIO
Cholesterol, Total: 209 mg/dL — ABNORMAL HIGH (ref 100–199)
HDL: 38 mg/dL — ABNORMAL LOW (ref >39)
LDL Chol Calc (NIH): 134 mg/dL — ABNORMAL HIGH (ref 0–99)
LDL/HDL Ratio: 3.5 ratio (ref 0.0–3.6)
Triglycerides: 208 mg/dL — ABNORMAL HIGH (ref 0–149)
VLDL Cholesterol Cal: 37 mg/dL (ref 5–40)

## 2021-02-22 LAB — HEMOGLOBIN A1C
Est. average glucose Bld gHb Est-mCnc: 103 mg/dL
Hgb A1c MFr Bld: 5.2 % (ref 4.8–5.6)

## 2021-02-22 LAB — RENAL FUNCTION PANEL
Albumin: 4.4 g/dL (ref 4.0–5.0)
BUN/Creatinine Ratio: 12 (ref 9–20)
BUN: 12 mg/dL (ref 6–24)
CO2: 23 mmol/L (ref 20–29)
Calcium: 9.5 mg/dL (ref 8.7–10.2)
Chloride: 105 mmol/L (ref 96–106)
Creatinine, Ser: 0.97 mg/dL (ref 0.76–1.27)
Glucose: 89 mg/dL (ref 70–99)
Phosphorus: 2.3 mg/dL — ABNORMAL LOW (ref 2.8–4.1)
Potassium: 4.5 mmol/L (ref 3.5–5.2)
Sodium: 141 mmol/L (ref 134–144)
eGFR: 99 mL/min/{1.73_m2} (ref >59)

## 2022-02-22 ENCOUNTER — Ambulatory Visit (INDEPENDENT_AMBULATORY_CARE_PROVIDER_SITE_OTHER): Payer: Commercial Managed Care - PPO | Admitting: Family Medicine

## 2022-02-22 ENCOUNTER — Encounter: Payer: Self-pay | Admitting: Family Medicine

## 2022-02-22 VITALS — BP 120/76 | HR 80 | Ht 68.0 in | Wt 199.0 lb

## 2022-02-22 DIAGNOSIS — Z23 Encounter for immunization: Secondary | ICD-10-CM

## 2022-02-22 DIAGNOSIS — Z0000 Encounter for general adult medical examination without abnormal findings: Secondary | ICD-10-CM | POA: Diagnosis not present

## 2022-02-22 LAB — HEMOCCULT GUIAC POC 1CARD (OFFICE): Fecal Occult Blood, POC: NEGATIVE

## 2022-02-22 NOTE — Progress Notes (Addendum)
Date:  02/22/2022   Name:  Cole Austin   DOB:  August 16, 1975   MRN:  [id number]   Chief Complaint: Annual Exam, Flu Vaccine, and tdap vaccine  Patient is a 46 year old male who presents for a comprehensive physical exam. The patient reports the following problems: none. Health maintenance has been reviewed today.      Lab Results  Component Value Date   NA 141 02/21/2021   K 4.5 02/21/2021   CO2 23 02/21/2021   GLUCOSE 89 02/21/2021   BUN 12 02/21/2021   CREATININE 0.97 02/21/2021   CALCIUM 9.5 02/21/2021   EGFR 99 02/21/2021   Lab Results  Component Value Date   CHOL 209 (H) 02/21/2021   HDL 38 (L) 02/21/2021   LDLCALC 134 (H) 02/21/2021   TRIG 208 (H) 02/21/2021   No results found for: "TSH" Lab Results  Component Value Date   HGBA1C 5.2 02/21/2021   No results found for: "WBC", "HGB", "HCT", "MCV", "PLT" No results found for: "ALT", "AST", "GGT", "ALKPHOS", "BILITOT" No results found for: "25OHVITD2", "25OHVITD3", "VD25OH"   Review of Systems  HENT: Negative.    Eyes: Negative.   Respiratory: Negative.    Cardiovascular: Negative.   Gastrointestinal: Negative.   Endocrine: Negative for polydipsia and polyuria.  Genitourinary: Negative.   Musculoskeletal:  Positive for myalgias.  Neurological: Negative.     Patient Active Problem List   Diagnosis Date Noted   Encounter for general adult medical examination without abnormal findings 10/01/2014   Low back strain 08/13/2014   Hx of fracture of rib 08/13/2014    No Known Allergies  Past Surgical History:  Procedure Laterality Date   LEG SURGERY     relieve pressure in leg   mouth wired     SHOULDER SURGERY Right    torn ligament/ exploratory   VASECTOMY N/A 08/09/2015   Procedure: VASECTOMY;  Surgeon: Hollice Espy, MD;  Location: [facility];  Service: Urology;  Laterality: N/A;   VASECTOMY      Social History   Tobacco Use   Smoking status: Former    Packs/day: 1.00    Types:  Cigarettes   Smokeless tobacco: Former    Types: Nurse, children's Use: Every day  Substance Use Topics   Alcohol use: No    Alcohol/week: 0.0 standard drinks of alcohol   Drug use: No     Medication list has been reviewed and updated.  No outpatient medications have been marked as taking for the 02/22/22 encounter (Office Visit) with Juline Patch, MD.       02/22/2022    8:39 AM 02/21/2021    8:52 AM 05/14/2019    3:44 PM  GAD 7 : Generalized Anxiety Score  Nervous, Anxious, on Edge 0 0 0  Control/stop worrying 0 0 0  Worry too much - different things 0 0 0  Trouble relaxing  0 0  Restless 0 0 0  Easily annoyed or irritable 0 0 0  Afraid - awful might happen 0 0 0  Total GAD 7 Score  0 0  Anxiety Difficulty Not difficult at all         02/22/2022    8:39 AM 02/21/2021    8:52 AM 05/14/2019    3:42 PM  Depression screen PHQ 2/9  Decreased Interest 0 0 0  Down, Depressed, Hopeless 0 0 0  PHQ - 2 Score 0 0 0  Altered sleeping 0 0  2  Tired, decreased energy 0 0 0  Change in appetite 0 0 0  Feeling bad or failure about yourself  0 0 0  Trouble concentrating 0 0 0  Moving slowly or fidgety/restless 0 0 0  Suicidal thoughts 0 0 0  PHQ-9 Score 0 0 2  Difficult doing work/chores Not difficult at all  Not difficult at all    BP Readings from Last 3 Encounters:  02/22/22 120/76  02/21/21 110/70  11/20/20 130/74    Physical Exam Vitals and nursing note reviewed.  HENT:     Head: Normocephalic.     Right Ear: External ear normal.     Left Ear: External ear normal.     Nose: Nose normal.  Eyes:     General: No scleral icterus.       Right eye: No discharge.        Left eye: No discharge.     Conjunctiva/sclera: Conjunctivae normal.     Pupils: Pupils are equal, round, and reactive to light.  Neck:     Thyroid: No thyromegaly.     Vascular: No JVD.     Trachea: No tracheal deviation.  Cardiovascular:     Rate and Rhythm: Normal rate and regular  rhythm.     Heart sounds: Normal heart sounds. No murmur heard.    No friction rub. No gallop.  Pulmonary:     Effort: No respiratory distress.     Breath sounds: Normal breath sounds. No wheezing or rales.  Abdominal:     General: Bowel sounds are normal.     Palpations: Abdomen is soft. There is no mass.     Tenderness: There is no abdominal tenderness. There is no guarding or rebound.  Genitourinary:    Prostate: Normal. Not enlarged, not tender and no nodules present.     Rectum: Normal. Guaiac result negative. No mass.  Musculoskeletal:        General: No tenderness. Normal range of motion.     Cervical back: Normal range of motion and neck supple.  Lymphadenopathy:     Cervical: No cervical adenopathy.  Skin:    General: Skin is warm.     Findings: No rash.  Neurological:     Mental Status: He is alert and oriented to person, place, and time.     Cranial Nerves: No cranial nerve deficit.     Deep Tendon Reflexes: Reflexes are normal and symmetric.     Wt Readings from Last 3 Encounters:  02/22/22 199 lb (90.3 kg)  02/21/21 198 lb (89.8 kg)  11/20/20 197 lb (89.4 kg)    BP 120/76 (BP Location: Left Arm, Cuff Size: Large)   Pulse 80   Ht _0  (1.727 m)   Wt 199 lb (90.3 kg)   SpO2 96%   BMI 30.26 kg/m   Assessment and Plan:  1. Annual physical exam Cole Austin is a 46 y.o. male who presents today for his Complete Annual Exam. He feels well. He reports exercising . He reports he is sleeping well.Immunizations are reviewed and recommendations provided.   Age appropriate screening tests are discussed. Counseling given for risk factor reduction interventions.  No subjective/objective concerns noted during history of present illness, past medical history and medications, review of systems, and physical exam.  - Lipid Panel With LDL/HDL Ratio - Comprehensive Metabolic Panel (CMET) - POCT Occult Blood Stool - CBC with Differential/Platelet  2. Need for  immunization against influenza Discussed that he administered. -  Flu Vaccine QUAD 31moIM (Fluarix, Fluzone & Alfiuria Quad PF)    DOtilio Miu MD

## 2022-02-22 NOTE — Patient Instructions (Signed)

## 2022-02-23 LAB — CBC WITH DIFFERENTIAL/PLATELET
Basophils Absolute: 0.1 10*3/uL (ref 0.0–0.2)
Basos: 1 %
EOS (ABSOLUTE): 0.2 10*3/uL (ref 0.0–0.4)
Eos: 2 %
Hematocrit: 52 % — ABNORMAL HIGH (ref 37.5–51.0)
Hemoglobin: 17.8 g/dL — ABNORMAL HIGH (ref 13.0–17.7)
Immature Grans (Abs): 0 10*3/uL (ref 0.0–0.1)
Immature Granulocytes: 0 %
Lymphocytes Absolute: 1.8 10*3/uL (ref 0.7–3.1)
Lymphs: 23 %
MCH: 30.8 pg (ref 26.6–33.0)
MCHC: 34.2 g/dL (ref 31.5–35.7)
MCV: 90 fL (ref 79–97)
Monocytes Absolute: 0.7 10*3/uL (ref 0.1–0.9)
Monocytes: 9 %
Neutrophils Absolute: 5.1 10*3/uL (ref 1.4–7.0)
Neutrophils: 65 %
Platelets: 198 10*3/uL (ref 150–450)
RBC: 5.78 x10E6/uL (ref 4.14–5.80)
RDW: 12.9 % (ref 11.6–15.4)
WBC: 7.9 10*3/uL (ref 3.4–10.8)

## 2022-02-23 LAB — COMPREHENSIVE METABOLIC PANEL
ALT: 15 IU/L (ref 0–44)
AST: 8 IU/L (ref 0–40)
Albumin/Globulin Ratio: 2.4 — ABNORMAL HIGH (ref 1.2–2.2)
Albumin: 4.4 g/dL (ref 4.1–5.1)
Alkaline Phosphatase: 90 IU/L (ref 44–121)
BUN/Creatinine Ratio: 12 (ref 9–20)
BUN: 13 mg/dL (ref 6–24)
Bilirubin Total: 0.3 mg/dL (ref 0.0–1.2)
CO2: 25 mmol/L (ref 20–29)
Calcium: 9.6 mg/dL (ref 8.7–10.2)
Chloride: 103 mmol/L (ref 96–106)
Creatinine, Ser: 1.13 mg/dL (ref 0.76–1.27)
Globulin, Total: 1.8 g/dL (ref 1.5–4.5)
Glucose: 78 mg/dL (ref 70–99)
Potassium: 4.6 mmol/L (ref 3.5–5.2)
Sodium: 140 mmol/L (ref 134–144)
Total Protein: 6.2 g/dL (ref 6.0–8.5)
eGFR: 82 mL/min/{1.73_m2} (ref >59)

## 2022-02-23 LAB — LIPID PANEL WITH LDL/HDL RATIO
Cholesterol, Total: 163 mg/dL (ref 100–199)
HDL: 36 mg/dL — ABNORMAL LOW (ref >39)
LDL Chol Calc (NIH): 90 mg/dL (ref 0–99)
LDL/HDL Ratio: 2.5 ratio (ref 0.0–3.6)
Triglycerides: 215 mg/dL — ABNORMAL HIGH (ref 0–149)
VLDL Cholesterol Cal: 37 mg/dL (ref 5–40)

## 2023-02-26 ENCOUNTER — Encounter: Payer: Commercial Managed Care - PPO | Admitting: Family Medicine
